# Patient Record
Sex: Male | Born: 1965 | Race: White | Hispanic: No | Marital: Single | Smoking: Current every day smoker
Health system: Southern US, Community
[De-identification: ages and names within clinical notes are randomized; demographics above are authoritative.]

## PROBLEM LIST (undated history)

## (undated) DIAGNOSIS — J449 Chronic obstructive pulmonary disease, unspecified: Secondary | ICD-10-CM

## (undated) DIAGNOSIS — M5430 Sciatica, unspecified side: Secondary | ICD-10-CM

## (undated) DIAGNOSIS — N200 Calculus of kidney: Secondary | ICD-10-CM

## (undated) DIAGNOSIS — F1910 Other psychoactive substance abuse, uncomplicated: Secondary | ICD-10-CM

## (undated) DIAGNOSIS — F419 Anxiety disorder, unspecified: Secondary | ICD-10-CM

## (undated) DIAGNOSIS — L0291 Cutaneous abscess, unspecified: Secondary | ICD-10-CM

## (undated) DIAGNOSIS — G35 Multiple sclerosis: Secondary | ICD-10-CM

## (undated) DIAGNOSIS — G8929 Other chronic pain: Secondary | ICD-10-CM

## (undated) DIAGNOSIS — M549 Dorsalgia, unspecified: Secondary | ICD-10-CM

## (undated) HISTORY — PX: BRAIN SURGERY: SHX531

## (undated) HISTORY — PX: WRIST SURGERY: SHX841

---
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
## 2003-05-22 ENCOUNTER — Ambulatory Visit (HOSPITAL_COMMUNITY): Admission: RE | Admit: 2003-05-22 | Discharge: 2003-05-22 | Payer: Self-pay | Admitting: Internal Medicine

## 2003-05-22 ENCOUNTER — Encounter: Payer: Self-pay | Admitting: Internal Medicine

## 2003-09-11 ENCOUNTER — Ambulatory Visit (HOSPITAL_COMMUNITY): Admission: RE | Admit: 2003-09-11 | Discharge: 2003-09-11 | Payer: Self-pay | Admitting: Internal Medicine

## 2004-01-08 ENCOUNTER — Emergency Department (HOSPITAL_COMMUNITY): Admission: EM | Admit: 2004-01-08 | Discharge: 2004-01-08 | Payer: Self-pay | Admitting: Emergency Medicine

## 2004-09-12 ENCOUNTER — Ambulatory Visit: Payer: Self-pay | Admitting: Orthopedic Surgery

## 2004-12-06 ENCOUNTER — Emergency Department (HOSPITAL_COMMUNITY): Admission: EM | Admit: 2004-12-06 | Discharge: 2004-12-06 | Payer: Self-pay | Admitting: Emergency Medicine

## 2004-12-13 ENCOUNTER — Emergency Department (HOSPITAL_COMMUNITY): Admission: EM | Admit: 2004-12-13 | Discharge: 2004-12-13 | Payer: Self-pay | Admitting: Emergency Medicine

## 2005-02-07 ENCOUNTER — Emergency Department (HOSPITAL_COMMUNITY): Admission: EM | Admit: 2005-02-07 | Discharge: 2005-02-07 | Payer: Self-pay | Admitting: Family Medicine

## 2005-02-28 ENCOUNTER — Ambulatory Visit (HOSPITAL_COMMUNITY): Admission: RE | Admit: 2005-02-28 | Discharge: 2005-02-28 | Payer: Self-pay | Admitting: Family Medicine

## 2005-05-10 ENCOUNTER — Emergency Department: Payer: Self-pay | Admitting: Emergency Medicine

## 2005-06-20 ENCOUNTER — Ambulatory Visit (HOSPITAL_COMMUNITY): Admission: RE | Admit: 2005-06-20 | Discharge: 2005-06-20 | Payer: Self-pay | Admitting: Family Medicine

## 2005-10-28 ENCOUNTER — Emergency Department (HOSPITAL_COMMUNITY): Admission: EM | Admit: 2005-10-28 | Discharge: 2005-10-28 | Payer: Self-pay | Admitting: Emergency Medicine

## 2005-11-12 ENCOUNTER — Emergency Department (HOSPITAL_COMMUNITY): Admission: EM | Admit: 2005-11-12 | Discharge: 2005-11-12 | Payer: Self-pay | Admitting: Emergency Medicine

## 2006-01-20 ENCOUNTER — Emergency Department (HOSPITAL_COMMUNITY): Admission: EM | Admit: 2006-01-20 | Discharge: 2006-01-21 | Payer: Self-pay | Admitting: Emergency Medicine

## 2006-05-14 ENCOUNTER — Ambulatory Visit: Payer: Self-pay | Admitting: Orthopedic Surgery

## 2006-09-05 ENCOUNTER — Emergency Department (HOSPITAL_COMMUNITY): Admission: EM | Admit: 2006-09-05 | Discharge: 2006-09-05 | Payer: Self-pay | Admitting: Emergency Medicine

## 2006-12-05 ENCOUNTER — Emergency Department (HOSPITAL_COMMUNITY): Admission: EM | Admit: 2006-12-05 | Discharge: 2006-12-05 | Payer: Self-pay | Admitting: Emergency Medicine

## 2006-12-17 ENCOUNTER — Emergency Department (HOSPITAL_COMMUNITY): Admission: EM | Admit: 2006-12-17 | Discharge: 2006-12-17 | Payer: Self-pay | Admitting: Emergency Medicine

## 2007-01-12 ENCOUNTER — Emergency Department (HOSPITAL_COMMUNITY): Admission: EM | Admit: 2007-01-12 | Discharge: 2007-01-12 | Payer: Self-pay | Admitting: Emergency Medicine

## 2007-01-23 ENCOUNTER — Emergency Department (HOSPITAL_COMMUNITY): Admission: EM | Admit: 2007-01-23 | Discharge: 2007-01-23 | Payer: Self-pay | Admitting: Emergency Medicine

## 2007-04-23 ENCOUNTER — Emergency Department (HOSPITAL_COMMUNITY): Admission: EM | Admit: 2007-04-23 | Discharge: 2007-04-23 | Payer: Self-pay | Admitting: Emergency Medicine

## 2007-04-25 ENCOUNTER — Ambulatory Visit: Payer: Self-pay | Admitting: Orthopedic Surgery

## 2007-06-06 ENCOUNTER — Ambulatory Visit: Payer: Self-pay | Admitting: Orthopedic Surgery

## 2007-06-06 DIAGNOSIS — S52539A Colles' fracture of unspecified radius, initial encounter for closed fracture: Secondary | ICD-10-CM | POA: Insufficient documentation

## 2007-06-21 ENCOUNTER — Ambulatory Visit: Payer: Self-pay | Admitting: Orthopedic Surgery

## 2007-06-21 ENCOUNTER — Ambulatory Visit (HOSPITAL_COMMUNITY): Admission: RE | Admit: 2007-06-21 | Discharge: 2007-06-21 | Payer: Self-pay | Admitting: Orthopedic Surgery

## 2007-06-27 ENCOUNTER — Telehealth (INDEPENDENT_AMBULATORY_CARE_PROVIDER_SITE_OTHER): Payer: Self-pay | Admitting: *Deleted

## 2007-06-27 ENCOUNTER — Encounter: Payer: Self-pay | Admitting: Orthopedic Surgery

## 2007-06-27 ENCOUNTER — Encounter (INDEPENDENT_AMBULATORY_CARE_PROVIDER_SITE_OTHER): Payer: Self-pay | Admitting: *Deleted

## 2007-07-01 ENCOUNTER — Ambulatory Visit: Payer: Self-pay | Admitting: Orthopedic Surgery

## 2007-07-10 ENCOUNTER — Ambulatory Visit: Payer: Self-pay | Admitting: Orthopedic Surgery

## 2007-07-15 ENCOUNTER — Ambulatory Visit: Payer: Self-pay | Admitting: Orthopedic Surgery

## 2007-07-24 ENCOUNTER — Emergency Department (HOSPITAL_COMMUNITY): Admission: EM | Admit: 2007-07-24 | Discharge: 2007-07-24 | Payer: Self-pay | Admitting: Emergency Medicine

## 2007-07-29 ENCOUNTER — Ambulatory Visit: Payer: Self-pay | Admitting: Orthopedic Surgery

## 2007-08-18 ENCOUNTER — Emergency Department (HOSPITAL_COMMUNITY): Admission: EM | Admit: 2007-08-18 | Discharge: 2007-08-18 | Payer: Self-pay | Admitting: Emergency Medicine

## 2007-08-19 ENCOUNTER — Ambulatory Visit: Payer: Self-pay | Admitting: Orthopedic Surgery

## 2007-08-21 ENCOUNTER — Emergency Department (HOSPITAL_COMMUNITY): Admission: EM | Admit: 2007-08-21 | Discharge: 2007-08-21 | Payer: Self-pay | Admitting: Emergency Medicine

## 2007-08-21 ENCOUNTER — Ambulatory Visit: Payer: Self-pay | Admitting: Internal Medicine

## 2007-09-11 ENCOUNTER — Ambulatory Visit: Payer: Self-pay | Admitting: Internal Medicine

## 2007-09-11 ENCOUNTER — Encounter: Payer: Self-pay | Admitting: Internal Medicine

## 2007-09-11 ENCOUNTER — Ambulatory Visit (HOSPITAL_COMMUNITY): Admission: RE | Admit: 2007-09-11 | Discharge: 2007-09-11 | Payer: Self-pay | Admitting: Internal Medicine

## 2007-09-16 ENCOUNTER — Emergency Department (HOSPITAL_COMMUNITY): Admission: EM | Admit: 2007-09-16 | Discharge: 2007-09-16 | Payer: Self-pay | Admitting: Emergency Medicine

## 2007-09-19 ENCOUNTER — Ambulatory Visit: Payer: Self-pay | Admitting: Orthopedic Surgery

## 2007-09-27 ENCOUNTER — Encounter (HOSPITAL_COMMUNITY): Admission: RE | Admit: 2007-09-27 | Discharge: 2007-10-27 | Payer: Self-pay | Admitting: Orthopedic Surgery

## 2007-09-27 ENCOUNTER — Encounter: Payer: Self-pay | Admitting: Orthopedic Surgery

## 2007-10-10 ENCOUNTER — Emergency Department: Payer: Self-pay | Admitting: Emergency Medicine

## 2007-10-23 ENCOUNTER — Emergency Department (HOSPITAL_COMMUNITY): Admission: EM | Admit: 2007-10-23 | Discharge: 2007-10-23 | Payer: Self-pay | Admitting: Emergency Medicine

## 2007-10-23 ENCOUNTER — Encounter (INDEPENDENT_AMBULATORY_CARE_PROVIDER_SITE_OTHER): Payer: Self-pay | Admitting: *Deleted

## 2007-11-14 ENCOUNTER — Encounter: Payer: Self-pay | Admitting: Orthopedic Surgery

## 2007-11-14 ENCOUNTER — Telehealth: Payer: Self-pay | Admitting: Orthopedic Surgery

## 2007-11-27 ENCOUNTER — Ambulatory Visit: Payer: Self-pay | Admitting: Orthopedic Surgery

## 2007-12-19 ENCOUNTER — Encounter: Payer: Self-pay | Admitting: Orthopedic Surgery

## 2008-01-02 ENCOUNTER — Telehealth: Payer: Self-pay | Admitting: Orthopedic Surgery

## 2008-01-10 ENCOUNTER — Emergency Department (HOSPITAL_COMMUNITY): Admission: EM | Admit: 2008-01-10 | Discharge: 2008-01-10 | Payer: Self-pay | Admitting: Emergency Medicine

## 2008-02-03 ENCOUNTER — Emergency Department (HOSPITAL_COMMUNITY): Admission: EM | Admit: 2008-02-03 | Discharge: 2008-02-03 | Payer: Self-pay | Admitting: Emergency Medicine

## 2008-02-12 ENCOUNTER — Emergency Department (HOSPITAL_COMMUNITY): Admission: EM | Admit: 2008-02-12 | Discharge: 2008-02-12 | Payer: Self-pay | Admitting: Emergency Medicine

## 2008-02-21 ENCOUNTER — Emergency Department (HOSPITAL_COMMUNITY): Admission: EM | Admit: 2008-02-21 | Discharge: 2008-02-21 | Payer: Self-pay | Admitting: Emergency Medicine

## 2008-02-25 ENCOUNTER — Telehealth: Payer: Self-pay | Admitting: Orthopedic Surgery

## 2008-04-03 ENCOUNTER — Emergency Department (HOSPITAL_COMMUNITY): Admission: EM | Admit: 2008-04-03 | Discharge: 2008-04-03 | Payer: Self-pay | Admitting: Emergency Medicine

## 2008-05-04 ENCOUNTER — Encounter: Payer: Self-pay | Admitting: Orthopedic Surgery

## 2008-08-15 ENCOUNTER — Emergency Department (HOSPITAL_COMMUNITY): Admission: EM | Admit: 2008-08-15 | Discharge: 2008-08-15 | Payer: Self-pay | Admitting: Emergency Medicine

## 2008-09-23 ENCOUNTER — Emergency Department (HOSPITAL_COMMUNITY): Admission: EM | Admit: 2008-09-23 | Discharge: 2008-09-23 | Payer: Self-pay | Admitting: Emergency Medicine

## 2008-09-27 ENCOUNTER — Emergency Department (HOSPITAL_COMMUNITY): Admission: EM | Admit: 2008-09-27 | Discharge: 2008-09-27 | Payer: Self-pay | Admitting: Emergency Medicine

## 2008-10-01 ENCOUNTER — Emergency Department (HOSPITAL_COMMUNITY): Admission: EM | Admit: 2008-10-01 | Discharge: 2008-10-01 | Payer: Self-pay | Admitting: Emergency Medicine

## 2008-10-14 ENCOUNTER — Emergency Department (HOSPITAL_COMMUNITY): Admission: EM | Admit: 2008-10-14 | Discharge: 2008-10-14 | Payer: Self-pay | Admitting: Emergency Medicine

## 2008-10-31 ENCOUNTER — Emergency Department (HOSPITAL_COMMUNITY): Admission: EM | Admit: 2008-10-31 | Discharge: 2008-10-31 | Payer: Self-pay | Admitting: Emergency Medicine

## 2008-11-05 ENCOUNTER — Emergency Department (HOSPITAL_COMMUNITY): Admission: EM | Admit: 2008-11-05 | Discharge: 2008-11-05 | Payer: Self-pay | Admitting: Emergency Medicine

## 2008-11-10 ENCOUNTER — Emergency Department (HOSPITAL_COMMUNITY): Admission: EM | Admit: 2008-11-10 | Discharge: 2008-11-10 | Payer: Self-pay | Admitting: Emergency Medicine

## 2008-11-11 ENCOUNTER — Ambulatory Visit (HOSPITAL_COMMUNITY): Admission: RE | Admit: 2008-11-11 | Discharge: 2008-11-11 | Payer: Self-pay | Admitting: Family Medicine

## 2008-12-06 ENCOUNTER — Emergency Department (HOSPITAL_COMMUNITY): Admission: EM | Admit: 2008-12-06 | Discharge: 2008-12-06 | Payer: Self-pay | Admitting: Emergency Medicine

## 2009-01-17 ENCOUNTER — Emergency Department (HOSPITAL_COMMUNITY): Admission: EM | Admit: 2009-01-17 | Discharge: 2009-01-17 | Payer: Self-pay | Admitting: Emergency Medicine

## 2009-01-20 ENCOUNTER — Emergency Department (HOSPITAL_COMMUNITY): Admission: EM | Admit: 2009-01-20 | Discharge: 2009-01-20 | Payer: Self-pay | Admitting: Emergency Medicine

## 2009-01-24 ENCOUNTER — Emergency Department (HOSPITAL_COMMUNITY): Admission: EM | Admit: 2009-01-24 | Discharge: 2009-01-24 | Payer: Self-pay | Admitting: Emergency Medicine

## 2009-03-01 ENCOUNTER — Emergency Department (HOSPITAL_COMMUNITY): Admission: EM | Admit: 2009-03-01 | Discharge: 2009-03-01 | Payer: Self-pay | Admitting: Emergency Medicine

## 2009-04-29 ENCOUNTER — Emergency Department (HOSPITAL_COMMUNITY): Admission: EM | Admit: 2009-04-29 | Discharge: 2009-04-29 | Payer: Self-pay | Admitting: Emergency Medicine

## 2009-05-07 ENCOUNTER — Emergency Department (HOSPITAL_COMMUNITY): Admission: EM | Admit: 2009-05-07 | Discharge: 2009-05-07 | Payer: Self-pay | Admitting: Emergency Medicine

## 2009-06-22 ENCOUNTER — Emergency Department (HOSPITAL_COMMUNITY): Admission: EM | Admit: 2009-06-22 | Discharge: 2009-06-22 | Payer: Self-pay | Admitting: Emergency Medicine

## 2009-07-06 ENCOUNTER — Emergency Department (HOSPITAL_COMMUNITY): Admission: EM | Admit: 2009-07-06 | Discharge: 2009-07-06 | Payer: Self-pay | Admitting: Emergency Medicine

## 2009-07-13 ENCOUNTER — Emergency Department (HOSPITAL_COMMUNITY): Admission: EM | Admit: 2009-07-13 | Discharge: 2009-07-13 | Payer: Self-pay | Admitting: Emergency Medicine

## 2009-07-30 ENCOUNTER — Emergency Department (HOSPITAL_COMMUNITY): Admission: EM | Admit: 2009-07-30 | Discharge: 2009-07-30 | Payer: Self-pay | Admitting: Emergency Medicine

## 2009-08-23 ENCOUNTER — Emergency Department (HOSPITAL_COMMUNITY): Admission: EM | Admit: 2009-08-23 | Discharge: 2009-08-23 | Payer: Self-pay | Admitting: Emergency Medicine

## 2009-09-02 ENCOUNTER — Emergency Department (HOSPITAL_COMMUNITY): Admission: EM | Admit: 2009-09-02 | Discharge: 2009-09-02 | Payer: Self-pay | Admitting: Emergency Medicine

## 2009-09-29 ENCOUNTER — Emergency Department (HOSPITAL_COMMUNITY): Admission: EM | Admit: 2009-09-29 | Discharge: 2009-09-29 | Payer: Self-pay | Admitting: Emergency Medicine

## 2009-10-06 ENCOUNTER — Emergency Department (HOSPITAL_COMMUNITY): Admission: EM | Admit: 2009-10-06 | Discharge: 2009-10-06 | Payer: Self-pay | Admitting: Emergency Medicine

## 2009-10-29 ENCOUNTER — Emergency Department (HOSPITAL_COMMUNITY): Admission: EM | Admit: 2009-10-29 | Discharge: 2009-10-29 | Payer: Self-pay | Admitting: Emergency Medicine

## 2010-01-11 ENCOUNTER — Emergency Department (HOSPITAL_COMMUNITY): Admission: EM | Admit: 2010-01-11 | Discharge: 2010-01-11 | Payer: Self-pay | Admitting: Emergency Medicine

## 2010-01-21 ENCOUNTER — Ambulatory Visit (HOSPITAL_COMMUNITY): Admission: RE | Admit: 2010-01-21 | Discharge: 2010-01-21 | Payer: Self-pay | Admitting: Family Medicine

## 2010-05-26 ENCOUNTER — Emergency Department (HOSPITAL_COMMUNITY)
Admission: EM | Admit: 2010-05-26 | Discharge: 2010-05-26 | Payer: Self-pay | Source: Home / Self Care | Admitting: Emergency Medicine

## 2010-06-13 ENCOUNTER — Emergency Department (HOSPITAL_COMMUNITY): Admission: EM | Admit: 2010-06-13 | Discharge: 2010-06-13 | Payer: Self-pay | Admitting: Emergency Medicine

## 2010-09-03 ENCOUNTER — Encounter: Payer: Self-pay | Admitting: Orthopedic Surgery

## 2010-10-17 ENCOUNTER — Inpatient Hospital Stay (HOSPITAL_COMMUNITY): Payer: Self-pay

## 2010-10-17 ENCOUNTER — Emergency Department (HOSPITAL_COMMUNITY): Payer: Self-pay

## 2010-10-17 ENCOUNTER — Inpatient Hospital Stay (HOSPITAL_COMMUNITY)
Admission: AD | Admit: 2010-10-17 | Discharge: 2010-10-27 | DRG: 026 | Disposition: A | Discharge status: Home Health | Payer: Self-pay | Source: Other Acute Inpatient Hospital | Attending: Neurosurgery | Admitting: Neurosurgery

## 2010-10-17 ENCOUNTER — Emergency Department (HOSPITAL_COMMUNITY)
Admission: EM | Admit: 2010-10-17 | Discharge: 2010-10-17 | Disposition: A | Discharge status: Other Acute Inpatient Hospital | Payer: Self-pay | Attending: Emergency Medicine | Admitting: Emergency Medicine

## 2010-10-17 DIAGNOSIS — R29898 Other symptoms and signs involving the musculoskeletal system: Secondary | ICD-10-CM | POA: Insufficient documentation

## 2010-10-17 DIAGNOSIS — D496 Neoplasm of unspecified behavior of brain: Principal | ICD-10-CM | POA: Diagnosis present

## 2010-10-17 DIAGNOSIS — M272 Inflammatory conditions of jaws: Secondary | ICD-10-CM | POA: Diagnosis present

## 2010-10-17 DIAGNOSIS — I498 Other specified cardiac arrhythmias: Secondary | ICD-10-CM | POA: Insufficient documentation

## 2010-10-17 DIAGNOSIS — K114 Fistula of salivary gland: Secondary | ICD-10-CM | POA: Diagnosis present

## 2010-10-17 DIAGNOSIS — L089 Local infection of the skin and subcutaneous tissue, unspecified: Secondary | ICD-10-CM | POA: Diagnosis present

## 2010-10-17 DIAGNOSIS — R109 Unspecified abdominal pain: Secondary | ICD-10-CM | POA: Insufficient documentation

## 2010-10-17 LAB — CBC
HCT: 42.3 % (ref 39.0–52.0)
Hemoglobin: 14.6 g/dL (ref 13.0–17.0)
MCH: 32.7 pg (ref 26.0–34.0)
MCHC: 34.5 g/dL (ref 30.0–36.0)
MCV: 94.6 fL (ref 78.0–100.0)
Platelets: 236 K/uL (ref 150–400)
RBC: 4.47 MIL/uL (ref 4.22–5.81)
RDW: 12.5 % (ref 11.5–15.5)
WBC: 8.1 10*3/uL (ref 4.0–10.5)

## 2010-10-17 LAB — COMPREHENSIVE METABOLIC PANEL WITH GFR
ALT: 27 U/L (ref 0–53)
Alkaline Phosphatase: 95 U/L (ref 39–117)
BUN: 5 mg/dL — ABNORMAL LOW (ref 6–23)
CO2: 26 meq/L (ref 19–32)
Chloride: 103 meq/L (ref 96–112)
GFR calc non Af Amer: >60 mL/min (ref >60)
Glucose, Bld: 98 mg/dL (ref 70–99)
Potassium: 4 meq/L (ref 3.5–5.1)
Sodium: 139 meq/L (ref 135–145)
Total Bilirubin: 0.7 mg/dL (ref 0.3–1.2)

## 2010-10-17 LAB — URINALYSIS, ROUTINE W REFLEX MICROSCOPIC
Bilirubin Urine: NEGATIVE
Glucose, UA: NEGATIVE mg/dL
Hgb urine dipstick: NEGATIVE
Ketones, ur: NEGATIVE mg/dL
Nitrite: NEGATIVE
Protein, ur: NEGATIVE mg/dL
Specific Gravity, Urine: 1.015 (ref 1.005–1.030)
Urobilinogen, UA: 0.2 mg/dL (ref 0.0–1.0)
pH: 5.5 (ref 5.0–8.0)

## 2010-10-17 LAB — DIFFERENTIAL
Basophils Absolute: 0 K/uL (ref 0.0–0.1)
Basophils Relative: 0 % (ref 0–1)
Eosinophils Absolute: 0.1 K/uL (ref 0.0–0.7)
Eosinophils Relative: 2 % (ref 0–5)
Lymphocytes Relative: 30 % (ref 12–46)
Lymphs Abs: 2.4 10*3/uL (ref 0.7–4.0)
Monocytes Absolute: 0.5 10*3/uL (ref 0.1–1.0)
Monocytes Relative: 6 % (ref 3–12)
Neutro Abs: 5.1 K/uL (ref 1.7–7.7)
Neutrophils Relative %: 63 % (ref 43–77)

## 2010-10-17 LAB — COMPREHENSIVE METABOLIC PANEL
AST: 21 U/L (ref 0–37)
Albumin: 4.1 g/dL (ref 3.5–5.2)
Calcium: 9.5 mg/dL (ref 8.4–10.5)
Creatinine, Ser: 0.9 mg/dL (ref 0.4–1.5)
GFR calc Af Amer: >60 mL/min (ref >60)
Total Protein: 7.3 g/dL (ref 6.0–8.3)

## 2010-10-17 MED ORDER — GADOBENATE DIMEGLUMINE 529 MG/ML IV SOLN
14.0000 mL | Freq: Once | INTRAVENOUS | Status: AC | PRN
  Administered 2010-10-17: 14 mL via INTRAVENOUS

## 2010-10-18 ENCOUNTER — Inpatient Hospital Stay (HOSPITAL_COMMUNITY): Payer: Self-pay

## 2010-10-18 LAB — URINALYSIS, ROUTINE W REFLEX MICROSCOPIC
Glucose, UA: NEGATIVE mg/dL
Ketones, ur: NEGATIVE mg/dL
pH: 6.5 (ref 5.0–8.0)

## 2010-10-18 MED ORDER — IOHEXOL 300 MG/ML  SOLN
100.0000 mL | Freq: Once | INTRAMUSCULAR | Status: AC | PRN
  Administered 2010-10-18: 100 mL via INTRAVENOUS

## 2010-10-18 MED ORDER — GADOBENATE DIMEGLUMINE 529 MG/ML IV SOLN
14.0000 mL | Freq: Once | INTRAVENOUS | Status: AC
  Administered 2010-10-18: 14 mL via INTRAVENOUS

## 2010-10-19 ENCOUNTER — Other Ambulatory Visit: Payer: Self-pay | Admitting: Neurosurgery

## 2010-10-19 LAB — CBC
HCT: 38.2 % — ABNORMAL LOW (ref 39.0–52.0)
Hemoglobin: 13.5 g/dL (ref 13.0–17.0)
MCH: 33.5 pg (ref 26.0–34.0)
MCV: 94.8 fL (ref 78.0–100.0)
Platelets: 214 10*3/uL (ref 150–400)
RBC: 4.03 MIL/uL — ABNORMAL LOW (ref 4.22–5.81)
WBC: 17.5 10*3/uL — ABNORMAL HIGH (ref 4.0–10.5)

## 2010-10-19 LAB — GRAM STAIN

## 2010-10-19 LAB — TYPE AND SCREEN
ABO/RH(D): O POS
Antibody Screen: NEGATIVE

## 2010-10-19 LAB — BASIC METABOLIC PANEL
BUN: 13 mg/dL (ref 6–23)
CO2: 27 mEq/L (ref 19–32)
Chloride: 102 mEq/L (ref 96–112)
Creatinine, Ser: 0.94 mg/dL (ref 0.4–1.5)
Potassium: 4.3 mEq/L (ref 3.5–5.1)

## 2010-10-19 LAB — PHOSPHORUS: Phosphorus: 4.4 mg/dL (ref 2.3–4.6)

## 2010-10-19 LAB — URINE CULTURE: Culture  Setup Time: 201203061341

## 2010-10-19 LAB — ABO/RH: ABO/RH(D): O POS

## 2010-10-20 LAB — HIV ANTIBODY (ROUTINE TESTING W REFLEX): HIV: NONREACTIVE

## 2010-10-20 NOTE — H&P (Signed)
  Sean, Kemp          ACCOUNT NO.:  [id number]  MEDICAL RECORD NO.:  [id number]           PATIENT TYPE:  I  LOCATION:  3114                         FACILITY:  [facility]  PHYSICIAN:  Donalee Citrin, M.D.        DATE OF BIRTH:  [birth year]  DATE OF ADMISSION:  10/17/2010 DATE OF DISCHARGE:                             HISTORY & PHYSICAL   REASON FOR ADMISSION:  Left-sided weakness, right frontal mass.  HISTORY OF PRESENT ILLNESS:  The patient is a 44-year gentleman who reports really onset of symptoms about a week or 2 ago, which was difficulty with speech, difficulty thinking, straight concentrating and weakness on his left arm and left leg, difficulty walking, bumping against walls and doors.  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  He has had headaches, bitemporal, lasting about 5-10 minutes at a time, but nothing profound.  He denies any fevers, chills, nausea, vomiting.  PAST MEDICAL HISTORY:  Remarkable for chronic back pain to which he saw Dr. Ethelene Hal a few years ago who has told that he had a ruptured disk, but it was nonsurgical at that point.  He has undergone orthopedic procedures of his wrist.  He denies any history of hypertension, diabetes, or cancer anywhere.  SOCIAL HISTORY:  He is a nondrinker.  No drug use, but 5-10 years ago, he was taking some IV drugs as well as alcohol.  He does smoke about a pack or 2 cigarettes a day.  REVIEW OF SYSTEMS:  He denies any fevers, chills, nausea, vomiting and positive for headaches, numbness, tingling, weakness on the left side of his body.  PHYSICAL EXAMINATION:  On neurologic exam, the patient is awake and alert.  He is oriented x4.  Pupils are equal and reactive to light. Extraocular movements are intact.  Cranial nerves are intact.  Strength is 5/5 in  his upper and lower extremity on the right.  He does have a mild left hemiparesis, a 4+/5.  There is almost a bit of neglect and apraxia there as well.  There is some mild sensory loss on the left side of his body.  His reflexes appear to be unremarkable.  CT scan does show cystic-appearing posterior right frontal mass that has a very thin enhancing wall consistent with I think could be an abscess, also could be a metastatic lesion versus a primary glioma.  I have recommended we continue to initiate a metastatic workup with CT scans of his chest, abdomen, pelvis; blood cultures; further laboratory workup; and probable biopsy versus excision on Wednesday depending on the medical workup as well as depending on the initial results of the biopsy.          ______________________________ Donalee Citrin, M.D.     GC/MEDQ  D:  10/17/2010  T:  10/18/2010  Job:  [id number]  Electronically Signed by Donalee Citrin M.D. on 10/19/2010 04:02:53 PM

## 2010-10-22 LAB — BASIC METABOLIC PANEL
Calcium: 8.7 mg/dL (ref 8.4–10.5)
Creatinine, Ser: 0.7 mg/dL (ref 0.4–1.5)
GFR calc Af Amer: >60 mL/min (ref >60)
GFR calc non Af Amer: >60 mL/min (ref >60)
Sodium: 140 mEq/L (ref 135–145)

## 2010-10-22 LAB — CBC
MCH: 32.1 pg (ref 26.0–34.0)
MCHC: 33.4 g/dL (ref 30.0–36.0)
Platelets: 193 10*3/uL (ref 150–400)

## 2010-10-22 LAB — VANCOMYCIN, TROUGH: Vancomycin Tr: 10.6 ug/mL (ref 10.0–20.0)

## 2010-10-23 LAB — CULTURE, ROUTINE-ABSCESS
Culture: NO GROWTH
Culture: NO GROWTH

## 2010-10-24 LAB — CULTURE, BLOOD (ROUTINE X 2): Culture: NO GROWTH

## 2010-10-24 LAB — ANAEROBIC CULTURE

## 2010-10-25 LAB — CBC
Platelets: 219 10*3/uL (ref 150–400)
RDW: 12.3 % (ref 11.5–15.5)
WBC: 9.1 10*3/uL (ref 4.0–10.5)

## 2010-10-25 LAB — BASIC METABOLIC PANEL
BUN: 12 mg/dL (ref 6–23)
GFR calc Af Amer: >60 mL/min (ref >60)
GFR calc non Af Amer: >60 mL/min (ref >60)
Potassium: 3.8 mEq/L (ref 3.5–5.1)
Sodium: 136 mEq/L (ref 135–145)

## 2010-10-25 LAB — VANCOMYCIN, TROUGH: Vancomycin Tr: 21.1 ug/mL — ABNORMAL HIGH (ref 10.0–20.0)

## 2010-10-26 ENCOUNTER — Inpatient Hospital Stay (HOSPITAL_COMMUNITY): Payer: Self-pay

## 2010-10-26 LAB — RAPID URINE DRUG SCREEN, HOSP PERFORMED
Barbiturates: NOT DETECTED
Cocaine: POSITIVE — AB
Opiates: NOT DETECTED

## 2010-10-26 LAB — CBC
HCT: 40.3 % (ref 39.0–52.0)
MCV: 100.9 fL — ABNORMAL HIGH (ref 78.0–100.0)
RDW: 12.1 % (ref 11.5–15.5)
WBC: 11.4 10*3/uL — ABNORMAL HIGH (ref 4.0–10.5)

## 2010-10-26 LAB — BASIC METABOLIC PANEL
BUN: 12 mg/dL (ref 6–23)
Chloride: 105 mEq/L (ref 96–112)
Potassium: 4.8 mEq/L (ref 3.5–5.1)

## 2010-10-26 LAB — ETHANOL: Alcohol, Ethyl (B): <5 mg/dL (ref 0–10)

## 2010-10-26 LAB — DIFFERENTIAL
Eosinophils Relative: 2 % (ref 0–5)
Lymphocytes Relative: 16 % (ref 12–46)
Lymphs Abs: 1.8 10*3/uL (ref 0.7–4.0)
Monocytes Absolute: 0.6 10*3/uL (ref 0.1–1.0)

## 2010-10-27 LAB — URINALYSIS, ROUTINE W REFLEX MICROSCOPIC
Bilirubin Urine: NEGATIVE
Glucose, UA: NEGATIVE mg/dL
Hgb urine dipstick: NEGATIVE
Ketones, ur: NEGATIVE mg/dL
pH: 5.5 (ref 5.0–8.0)

## 2010-10-30 NOTE — Op Note (Signed)
NAMEEMMANUEL, Sean Kemp          ACCOUNT NO.:  [id number]  MEDICAL RECORD NO.:  [id number]           PATIENT TYPE:  I  LOCATION:  3114                         FACILITY:  [facility]  PHYSICIAN:  Donalee Citrin, M.D.        DATE OF BIRTH:  1965/10/30  DATE OF PROCEDURE:  10/19/2010 DATE OF DISCHARGE:                              OPERATIVE REPORT   PREOPERATIVE DIAGNOSIS:  Right frontal mass.  POSTOPERATIVE DIAGNOSIS:  Right frontal mass.  PROCEDURE:  Stealth craniotomy for evacuation of right frontal mass.  SURGEON:  Donalee Citrin, MD  ASSISTANT:  Tia Alert, MD  ANESTHESIA:  General endotracheal.  FROZEN SPECIMEN AND MICROBIOLOGY SPECIMENS:  Pending.  Initial frozen specimen consistent with a lymphoproliferative inflammatory process.  HISTORY OF PRESENT ILLNESS:  The patient is a 45 year old gentleman who presented with a week or two history of left hemiparesis.  CT and MRI scan showed a right frontal rim enhancing mass.  The patient presents for evaluation and resection.  The patient was brought to the OR, was induced under general anesthesia, positioned supine, with the neck in slight flexion slightly turned to the left exposing the right hemisphere.  Then using stealth after adequate loading of the stealth MRI, a registration of the surface landmarks.  A linear incision was mapped out.  The head was shaved, prepped and draped in the usual sterile fashion and the lesion was identified in the scalp.  This was used to the center point of a linear incision and linear incision was carried in the bucket handle approach overlying the right ear across the midline to over the left ear and after adequate prepping and draping and infiltration of 10 mL lidocaine with epi, a linear incision was made and the Raney clips were applied and then at this point, stealth was brought back in.  The center point of the lesion was identified and a bur hole was drilled.  Then through that bur hole, the  dura was bipolared and incised in a cruciate fashion using a brain needle.  Attempts were made to aspirate the cyst fluid thinking that the high likelihood that this maybe an abscess and I have aspirated the cyst fluid with a yield on diagnostic fluid for abscess.  We could board the room into the case and treat with IV antibiotics.  However, there was no real aspiratable cyst fluid and there was some couple drops of clear fluid that egressed from the cyst, but from the most part it was followed with very minimal fluid and so at this point, cranial flap was turned for further resection of the mass and so at this point, the flap was turned.  The dura was incised in a reverse C fashion, flapped over the superior sagittal sinus.  Then the center point over the tumor was again appreciated. Corticectomy was used at that point, extending that and frontally taking care not to extend too far posteriorly and to get too close to the motor strip.  The cyst wall was immediately identified.  Initial, a couple specimens were sent for frozen pathology that came back just inflammatory and possible infectious process so some  additional sample in the original syringe which captured approximately 1 drop or 1 mL of fluid was sent off for microbiology.  That microinitial Gram stain came back with just polys and monos and no organisms.  Further resection of the cyst wall cavity and additional tissue ultimately showed some frozen section consistent with a lymphoproliferative disorder and the pathologist confirmed diagnostic tissue so after the complete cyst wall was removed, there was no more abnormal tissue.  Stealth was again reacquired, used each step along the way to confirm adequate anterior- posterior, superior-inferior margins of the resection bed.  Then at this point, the antibiotics were not given prior to the surgery.  However, they were given after initial aspiration of the cyst fluid.  The  patient had been on antibiotics orally prior to admission in the hospital for treatment of a draining pustulous lesion along his left mandible. However, this has stopped draining several weeks ago and he had been off antibiotics for several weeks.  So after resection, meticulous hemostasis was maintained.  The wound was copiously irrigated.  The dura was then reapproximated.  Gelfoam was laid on the top of the dura.  Bone flap was reapplied with the Lorenz plating system and the linear incision was closed with interrupted Vicryl and the galea and staples. The wound was then dressed and the patient went to the recovery room in stable condition.          ______________________________ Donalee Citrin, M.D.     GC/MEDQ  D:  10/19/2010  T:  10/20/2010  Job:  [id number]  Electronically Signed by Donalee Citrin M.D. on 10/30/2010 03:59:32 PM

## 2010-10-31 LAB — URINALYSIS, ROUTINE W REFLEX MICROSCOPIC
Bilirubin Urine: NEGATIVE
Nitrite: NEGATIVE
Urobilinogen, UA: 0.2 mg/dL (ref 0.0–1.0)

## 2010-11-02 LAB — URINALYSIS, ROUTINE W REFLEX MICROSCOPIC
Glucose, UA: NEGATIVE mg/dL
Hgb urine dipstick: NEGATIVE
pH: 5.5 (ref 5.0–8.0)

## 2010-11-04 NOTE — Consult Note (Signed)
  [name], [name]          ACCOUNT NO.:  [id number]  MEDICAL RECORD NO.:  [id number]           PATIENT TYPE:  LOCATION:                                 FACILITY:  PHYSICIAN:  Christopher E. 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  BRIEF HISTORY:  Sean Kemp is a 45 year old gentleman who is presently admitted to the hospital because of left-sided weakness and a right frontal brain mass.  There is question whether this was an infectious or neoplastic process.  He is status post craniotomy with removal of the frontal mass and path pending.  He has apparently also had a chronic draining sore fistula along the edge of the mandible on the left side.  He states that this initially began after having incision and drainage of a cyst in this region about 6-8 months ago. Sometimes, this clears up and sometimes it is drains and swells up a little bit.  When it swells, he seems to have a little bit more pain and discomfort in the jaw and teeth.  PHYSICAL EXAMINATION:  On exam, he has a small fistula which has not swollen.  There is no active drainage noted, but this is very adherent to the mandible inferiorly.  Intraoral exam reveals no significant swelling of the periodontal area.  He does have some cavities in the dental abnormalities.  No obvious intraoral abscess noted.  He has no other evidence of infection and no active drainage presently.  IMPRESSION:  Left neck fistula.  I suspect may be related to chronic dental or mandibular infection osteitis or related to initial periodontal abscess.  Presently, no acute symptoms, but would recommend evaluation by dentist and oral surgeon prior to excising the fistula tract.  Nothing needs to be done  acutely.  Would recommend initial evaluation with oral surgeon or dentist and discuss this with the patient and can follow up in my office as an outpatient for excision of the fistula tract if necessary.   Thank          ______________________________ Kristine Garbe Ezzard Standing, M.D.     CEN/MEDQ  D:  10/21/2010  T:  10/22/2010  Job:  [id number]  Electronically Signed by Dillard Cannon M.D. on 11/04/2010 11:12:53 AM

## 2010-11-10 ENCOUNTER — Other Ambulatory Visit (HOSPITAL_COMMUNITY): Payer: Self-pay | Admitting: Neurosurgery

## 2010-11-10 DIAGNOSIS — R519 Headache, unspecified: Secondary | ICD-10-CM

## 2010-11-15 ENCOUNTER — Other Ambulatory Visit: Payer: Self-pay | Admitting: Neurosurgery

## 2010-11-15 DIAGNOSIS — R51 Headache: Secondary | ICD-10-CM

## 2010-11-16 ENCOUNTER — Other Ambulatory Visit (HOSPITAL_COMMUNITY): Payer: Self-pay | Admitting: Neurosurgery

## 2010-11-16 ENCOUNTER — Ambulatory Visit (HOSPITAL_COMMUNITY)
Admission: RE | Admit: 2010-11-16 | Discharge: 2010-11-16 | Disposition: A | Discharge status: Home / Self Care | Payer: Self-pay | Source: Ambulatory Visit | Attending: Neurosurgery | Admitting: Neurosurgery

## 2010-11-16 ENCOUNTER — Encounter (HOSPITAL_COMMUNITY): Payer: Self-pay

## 2010-11-16 DIAGNOSIS — R51 Headache: Secondary | ICD-10-CM | POA: Insufficient documentation

## 2010-11-16 DIAGNOSIS — G060 Intracranial abscess and granuloma: Secondary | ICD-10-CM | POA: Insufficient documentation

## 2010-11-16 LAB — COMPREHENSIVE METABOLIC PANEL
ALT: 18 U/L (ref 0–53)
Alkaline Phosphatase: 71 U/L (ref 39–117)
CO2: 27 mEq/L (ref 19–32)
Chloride: 105 mEq/L (ref 96–112)
Glucose, Bld: 102 mg/dL — ABNORMAL HIGH (ref 70–99)
Potassium: 3.9 mEq/L (ref 3.5–5.1)
Sodium: 138 mEq/L (ref 135–145)
Total Protein: 7.3 g/dL (ref 6.0–8.3)

## 2010-11-16 LAB — COMPREHENSIVE METABOLIC PANEL WITH GFR
AST: 16 U/L (ref 0–37)
Albumin: 4 g/dL (ref 3.5–5.2)
BUN: 12 mg/dL (ref 6–23)
Calcium: 9.2 mg/dL (ref 8.4–10.5)
Creatinine, Ser: 0.7 mg/dL (ref 0.4–1.5)
GFR calc Af Amer: >60 mL/min (ref >60)
GFR calc non Af Amer: >60 mL/min (ref >60)
Total Bilirubin: 0.5 mg/dL (ref 0.3–1.2)

## 2010-11-16 LAB — URINALYSIS, ROUTINE W REFLEX MICROSCOPIC
Nitrite: NEGATIVE
Specific Gravity, Urine: 1.015 (ref 1.005–1.030)
pH: 7 (ref 5.0–8.0)

## 2010-11-16 LAB — CBC
HCT: 42.1 % (ref 39.0–52.0)
Hemoglobin: 14.4 g/dL (ref 13.0–17.0)
MCHC: 34.2 g/dL (ref 30.0–36.0)
MCV: 102.6 fL — ABNORMAL HIGH (ref 78.0–100.0)
Platelets: 239 K/uL (ref 150–400)
RBC: 4.1 MIL/uL — ABNORMAL LOW (ref 4.22–5.81)
RDW: 12.1 % (ref 11.5–15.5)
WBC: 6.5 10*3/uL (ref 4.0–10.5)

## 2010-11-16 LAB — DIFFERENTIAL
Basophils Absolute: 0 K/uL (ref 0.0–0.1)
Basophils Relative: 0 % (ref 0–1)
Eosinophils Absolute: 0.1 10*3/uL (ref 0.0–0.7)
Eosinophils Relative: 2 % (ref 0–5)
Lymphocytes Relative: 28 % (ref 12–46)
Lymphs Abs: 1.8 K/uL (ref 0.7–4.0)
Monocytes Absolute: 0.4 10*3/uL (ref 0.1–1.0)
Monocytes Relative: 6 % (ref 3–12)
Neutro Abs: 4.1 K/uL (ref 1.7–7.7)
Neutrophils Relative %: 63 % (ref 43–77)

## 2010-11-16 LAB — LIPASE, BLOOD: Lipase: 34 U/L (ref 11–59)

## 2010-11-16 MED ORDER — IOHEXOL 300 MG/ML  SOLN
100.0000 mL | Freq: Once | INTRAMUSCULAR | Status: AC | PRN
  Administered 2010-11-16: 100 mL via INTRAVENOUS

## 2010-11-17 ENCOUNTER — Other Ambulatory Visit (HOSPITAL_COMMUNITY): Payer: Self-pay

## 2010-11-17 ENCOUNTER — Inpatient Hospital Stay: Admission: RE | Admit: 2010-11-17 | Payer: Self-pay | Source: Ambulatory Visit

## 2010-11-17 NOTE — Discharge Summary (Signed)
Sean Kemp, Sean Kemp          ACCOUNT NO.:  [id number]  MEDICAL RECORD NO.:  [id number]           PATIENT TYPE:  I  LOCATION:  3016                         FACILITY:  [facility]  PHYSICIAN:  Donalee Citrin, M.D.        DATE OF BIRTH:  [birth year]  DATE OF ADMISSION:  10/17/2010 DATE OF DISCHARGE:  10/27/2010                              DISCHARGE SUMMARY   ADMITTING DIAGNOSIS:  Intracerebral abscess versus tumor.  FINAL DIAGNOSIS:  Intracerebral abscess.  HOSPITAL COURSE:  The patient is a 45 year old gentleman who was admitted through the emergency department with left-sided weakness, dysarthria, and numbness in the left side with some declining mental status.  Workup revealed a ring-enhancing lesion right frontal.  The patient was admitted and underwent full workup with CT scans of his chest, abdomen, and pelvis to rule out mets, underwent laboratory workup with blood cultures to rule out abscess, and underwent MRI scan to evaluate the lesion.  Metastatic workup was negative.  Blood cultures were also negative.  MRI scan was consistent with possible abscess but also possible primary glioma.  So, the patient was ultimately recommended a stealth craniotomy for biopsy and excision of right frontal mass.  Risks and benefits were explained to the patient.  He understood and agreed to proceed forward.  So, the patient was taken to the OR, underwent the aforementioned procedure.  Initial frozen pathology was consistent with possible primary malignancy consistent with either CNS lymphoma or some other kind of lesion.  The stat gram stain obtained from the OR did not show any bacteria, did show some white cells, so final pathology was sent off and pending.  The patient went to the ICU, recovered there well, was transferred to floor.  Over the several days postoperatively, the patient continued to convalesce and improved significantly with increase left-sided strength, increased alertness,  decreased swelling of his brain.  All the workup and pathology ultimately sent to [facility] and it was felt to be mostly reactive, consistently a demyelinating disease or possibly infection. The patient was maintained on IV antibiotics during all time treating as if it was infection, also maintained on Decadron on a weaning dose and protocol as the patient's clinical exam continued to improve.  There was also a lesion on his left jaw that was draining chronically.  I had ENT seen him, they recommended dental follow up and the patient subsequently was scheduled for this as an outpatient.  Over the next several days, the patient continued to do well with good physical therapy, had a PICC line placed for his chronic IV antibiotics to which he was maintained on vancomycin and Rocephin.  Physical therapy felt the patient was stable to be discharged home with home health as opposed to the inpatient physical therapy, so this was all set up for him and by March 15, the patient was able to be discharged home on IV antibiotics, was ambulating, voiding spontaneously, was discharged with home health and home physical therapy and scheduled follow up in approximately 2 weeks.          ______________________________ Donalee Citrin, M.D.    GC/MEDQ  D:  11/09/2010  T:  11/10/2010  Job:  [id number]  Electronically Signed by Donalee Citrin M.D. on 11/17/2010 08:36:54 AM

## 2010-11-24 LAB — URINALYSIS, ROUTINE W REFLEX MICROSCOPIC
Glucose, UA: NEGATIVE mg/dL
Hgb urine dipstick: NEGATIVE
Ketones, ur: NEGATIVE mg/dL
Protein, ur: NEGATIVE mg/dL

## 2010-11-29 LAB — BASIC METABOLIC PANEL
BUN: 9 mg/dL (ref 6–23)
CO2: 27 mEq/L (ref 19–32)
Chloride: 102 mEq/L (ref 96–112)
Creatinine, Ser: 0.86 mg/dL (ref 0.4–1.5)
Glucose, Bld: 121 mg/dL — ABNORMAL HIGH (ref 70–99)

## 2010-11-29 LAB — DIFFERENTIAL
Basophils Relative: 1 % (ref 0–1)
Eosinophils Absolute: 0.1 10*3/uL (ref 0.0–0.7)
Neutrophils Relative %: 58 % (ref 43–77)

## 2010-11-29 LAB — CBC
MCHC: 35.3 g/dL (ref 30.0–36.0)
MCV: 100.6 fL — ABNORMAL HIGH (ref 78.0–100.0)
Platelets: 297 10*3/uL (ref 150–400)

## 2010-11-29 LAB — POCT CARDIAC MARKERS: Myoglobin, poc: 70.5 ng/mL (ref 12–200)

## 2010-12-06 ENCOUNTER — Ambulatory Visit (HOSPITAL_COMMUNITY)
Admission: RE | Admit: 2010-12-06 | Discharge: 2010-12-06 | Disposition: A | Discharge status: Home / Self Care | Payer: Self-pay | Source: Ambulatory Visit | Attending: Neurosurgery | Admitting: Neurosurgery

## 2010-12-06 DIAGNOSIS — R519 Headache, unspecified: Secondary | ICD-10-CM

## 2010-12-06 DIAGNOSIS — R51 Headache: Secondary | ICD-10-CM | POA: Insufficient documentation

## 2010-12-06 MED ORDER — GADOBENATE DIMEGLUMINE 529 MG/ML IV SOLN: 13.0000 mL | Freq: Once | INTRAVENOUS | Status: AC | PRN

## 2010-12-27 NOTE — Op Note (Signed)
NAME:  Sean Kemp, Sean Kemp          ACCOUNT NO.:  [id number]   MEDICAL RECORD NO.:  [id number]          PATIENT TYPE:  EMS   LOCATION:  ED                            FACILITY:  [facility]   PHYSICIAN:  R. Roetta Sessions, M.D. DATE OF BIRTH:  [birth year]   DATE OF PROCEDURE:  08/21/2007  DATE OF DISCHARGE:                               OPERATIVE REPORT   PROCEDURE PERFORMED:  Emergency esophagogastroduodenoscopy with food  impaction removal.   INDICATIONS FOR PROCEDURE:  45 year old gentleman with chronic reflux  esophageal dysphagia to solid foods, was eating a cheeseburger today,  took a large bite, followed it without chewing very much, felt it stick  behind his breast bone. He came to the ER and saw Dr. Margretta Ditty, who  evaluated him and called me.  I have seen him and his history is as  stated.  I have offered him an emergency EGD with food impaction and  removal as appropriate.  I told him we may not carry out a complete exam  today and if he has food in his upper GI tract, I would not be able to  dilate his esophagus.  His questions were answered, he is agreeable.  It  is notable since he came from the emergency department, he feels that he  is able to swallow somewhat better.  Please see the handwritten history  and physical procedure note.   DESCRIPTION OF PROCEDURE:  O2 saturation, blood pressure, pulse rate,  and respirations were monitored throughout the entire procedure.  Conscious sedation with Versed 4 mg IV and Demerol 100 mg IV in divided  doses, Cetacaine spray for topical pharyngeal anesthesia.  The  instrument Pentax video chip system.   FINDINGS:  Examination of the tubular esophagus revealed a couple of  small pieces of food debris in the distal esophagus, however, there was  no large obstructing bolus.  These small pieces were pushed on through  without difficulty.  He did have distal esophageal erosion extending up  a good 7-8 cm from the EG junction.  He did have  narrowing of the EG  junction with some stricturing and ring formation.  There was no  Barrett's esophagus.  The EG junction was easily traversed.  A complete  exam of the stomach was not carried out as there was a large area of  retained food in the fundus which I elected not to attempt carrying out  a complete examination.  The patient tolerated the procedure very well,  was reacted in endoscopy.   IMPRESSION:  Distal esophageal erosions, distal esophageal  ring/stricture, status post removal of food from the esophagus as  described above, retained food in the stomach, complete exam not carried  out.   RECOMMENDATIONS:  1. Soft, full liquid diet.  2. Begin Zegerid 40 mg orally daily, I have given him a prescription      for the suspension.  3. Gastroesophageal reflux disease literature provided to Mr.      Uy.  4. I will plan to see [name] back in a couple of weeks to      arrange elective EGD with esophageal  dilation.      [name], M.D.  Electronically Signed     RMR/MEDQ  D:  08/21/2007  T:  08/21/2007  Job:  [id number]   cc:   Rhae Lerner. Margretta Ditty, M.D.  [address]  [place]  [place] [address]
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
## 2010-12-27 NOTE — Op Note (Signed)
NAME:  Sean Kemp, Sean Kemp          ACCOUNT NO.:  [id number]   MEDICAL RECORD NO.:  [id number]          PATIENT TYPE:  AMB   LOCATION:  DAY                           FACILITY:  [facility]   PHYSICIAN:  R. Roetta Sessions, M.D. DATE OF BIRTH:  05-Sep-1965   DATE OF PROCEDURE:  09/11/2007  DATE OF DISCHARGE:                               OPERATIVE REPORT   PROCEDURE:  EGD with Elease Hashimoto dilation with biopsy.   INDICATIONS FOR PROCEDURE:  A 45 year old gentleman recently with  chronic esophageal dysphagia and GERD presented with food impaction.  I  recently emergently disimpacted him.  He returns now for a complete exam  and dilation as appropriate.  This approach has been discussed with the  patient at length.  Potential risks, benefits and alternatives have been  reviewed and questions answered.  He was agreeable.  Please see the  doctor dictation in the medical record.   O2 saturation, blood pressure, pulse, respirations were monitored the  entire procedure.  Conscious sedation Versed 6 mg IV, Demerol 100 mg IV  in divided doses.  Cetacaine spray for topical pharyngeal anesthesia.   INSTRUMENT:  Pentax video chip system.   FINDINGS:  Examination achieved of lower esophagus revealed two distal  Tandem Schatzki's rings.  The esophageal mucosa otherwise appeared  normal.  There was no esophagitis.  There was no feline appearance of  the more proximal esophagus.  There were no longitudinal furrows,  nothing to suggest eosinophilic esophagitis.   There was a small almost submucosal appearing nodule about 5 mm in  dimension at the GE junction with a whitish head.  Please see photos.  It appeared to be benign.  The EG junction was easily traversed.  Entered the stomach,  colon, gastric cavity was emptied, insufflated  well with air.  Thorough examination of the gastric mucosa including  retroflexed view of the proximal stomach, esophagogastric junction  demonstrated only a small hiatal hernia.   Pylorus was patent and easily  traversed.  Examination of the bulb and second portion revealed no  abnormalities.  Therapeutic/diagnostic maneuvers were performed.  The  scope was withdrawn.  A 56 French Maloney dilator was passed to full  insertion with ease.  A look back revealed the rings had been disrupted  without apparent complication.  Subsequently the nodule was biopsied for  histologic study.  The patient tolerated the procedure well and was  reactive after endoscopy.   IMPRESSION:  Distal Tandem Schatzki's ring status post dilation with  disruption described above, esophageal nodule biopsied after dilation,  otherwise normal esophagus, small hiatal hernia, otherwise normal  stomach, D1 and D2.   RECOMMENDATIONS:  1. Continue Zegerid 40 mg orally daily.  2. Antireflux material/hiatal hernia literature provided to Mr.      Molzahn.  3. Follow up on path.  4. Further recommendations to follow.  5. Will schedule an appointment with [place] in the office to see how he is      doing.      [name], M.D.  Electronically Signed     RMR/MEDQ  D:  09/11/2007  T:  09/11/2007  Job:  [id number] 

## 2011-01-21 ENCOUNTER — Emergency Department (HOSPITAL_COMMUNITY)
Admission: EM | Admit: 2011-01-21 | Discharge: 2011-01-21 | Disposition: A | Discharge status: Home / Self Care | Payer: Self-pay | Attending: Emergency Medicine | Admitting: Emergency Medicine

## 2011-01-21 DIAGNOSIS — M25539 Pain in unspecified wrist: Secondary | ICD-10-CM | POA: Insufficient documentation

## 2011-04-12 ENCOUNTER — Emergency Department (HOSPITAL_COMMUNITY): Payer: Self-pay

## 2011-04-12 ENCOUNTER — Encounter (HOSPITAL_COMMUNITY): Payer: Self-pay

## 2011-04-12 ENCOUNTER — Emergency Department (HOSPITAL_COMMUNITY)
Admission: EM | Admit: 2011-04-12 | Discharge: 2011-04-12 | Disposition: A | Discharge status: Home / Self Care | Payer: Self-pay | Attending: Emergency Medicine | Admitting: Emergency Medicine

## 2011-04-12 DIAGNOSIS — R51 Headache: Secondary | ICD-10-CM | POA: Insufficient documentation

## 2011-04-12 DIAGNOSIS — G35 Multiple sclerosis: Secondary | ICD-10-CM | POA: Insufficient documentation

## 2011-04-12 DIAGNOSIS — R443 Hallucinations, unspecified: Secondary | ICD-10-CM | POA: Insufficient documentation

## 2011-04-12 HISTORY — DX: Multiple sclerosis: G35

## 2011-04-12 MED ORDER — PREDNISONE (PAK) 10 MG PO TABS: 10.0000 mg | ORAL_TABLET | Freq: Every day | ORAL | Status: AC

## 2011-04-12 MED ORDER — PREDNISONE 20 MG PO TABS
40.0000 mg | ORAL_TABLET | Freq: Once | ORAL | Status: AC
  Administered 2011-04-12: 40 mg via ORAL
  Filled 2011-04-12: qty 2

## 2011-04-12 NOTE — ED Notes (Signed)
Pt reports since yesterday morning has been seeing things that aren't there, talking to people that aren't there, having difficulty controlling left side of body.  Reports has history of MS and had brain surgery in March of 2012.  Denies SI or HI.

## 2011-04-12 NOTE — ED Provider Notes (Signed)
History   Scribed for Vida Roller, MD, the patient was seen in room APA10/APA10. This chart was scribed by Clarita Crane. This patient's care was started at 10:43AM.   CSN: [id number] Arrival date & time: 04/12/2011 10:15 AM  Chief Complaint  Patient presents with  . Hallucinations   The history is provided by the patient, the spouse and medical records.   Sean Kemp is a 45 y.o. male who presents to the Emergency Department complaining of intermittent left sided tremors, visual hallucinations and dizziness described as "my balance is off" onset yesterday and persistent since. Denies fever, nausea, vomiting, chest pain, SOB, HA, changes in vision, SI/HI. Patient states he experienced a hallucination last night in which he saw his sister trying to get into his home but when he went to let her in his brother told him his sister had not been to the house. 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 Also notes he was dx with Multiple Sclerosis in March 2012 but has not experienced significant symptoms since his diagnosis.     Past Medical History  Diagnosis Date  . Multiple sclerosis     Past Surgical History  Procedure Date  . Brain surgery   . Wrist surgery     No family history on file.  History  Substance Use Topics  . Smoking status: Never Smoker   . Smokeless tobacco: Not on file  . Alcohol Use: No      Review of Systems 10 Systems reviewed and are negative for acute change except as noted in the HPI.  Physical Exam  BP 122/83  Pulse 70  Temp(Src) 98.3 F (36.8 C) (Oral)  Resp 18  Ht 5\' 7"  (1.702 m)  Wt 135 lb (61.236 kg)  BMI 21.14 kg/m2  SpO2 100%  Physical Exam  Nursing note and vitals reviewed. Constitutional: He is oriented to person, place, and time. He appears well-developed and well-nourished. No distress.  HENT:  Head: Normocephalic and atraumatic.  Mouth/Throat: Oropharynx is  clear and moist.  Eyes: Conjunctivae are normal. Pupils are equal, round, and reactive to light.  Neck: Neck supple.  Cardiovascular: Normal rate, regular rhythm and normal heart sounds.  Exam reveals no gallop and no friction rub.   No murmur heard. Pulmonary/Chest: Effort normal and breath sounds normal. He has no wheezes.  Abdominal: Soft. Bowel sounds are normal. He exhibits no distension. There is no tenderness.  Musculoskeletal: Normal range of motion. He exhibits no edema.  Neurological: He is alert and oriented to person, place, and time. He is not disoriented. No cranial nerve deficit or sensory deficit. Coordination normal.       Minimal weakness of left upper extremity compared to right but still rate 5/5. Strength normal and equal of bilateral lower extremities. Sensation normal. Decreased DTR bilaterally. Normal finger nose finger test.   Skin: Skin is warm and dry.  Psychiatric: He has a normal mood and affect. His behavior is normal. Judgment and thought content normal.    ED Course  Procedures  MDM Overall the patient appears very well with normal vital signs and essentially normal neurologic exam. Given that he was diagnosed with multiple sclerosis in March after he was diagnosed with a brain abscess and given that he has not been on steroids in several months, this is a likely next up for him today. We'll do a CT scan to rule out any other sources of these  neurologic findings including his mild hallucinations. He does not appear to have any significant focal neurologic deficits today.  In addition there is no tremor or abnormal coordination today. He has a normal finger-nose-finger, normal rapid alternating movements and normal coordination without any limb ataxia. CT scan pending.   Ct Head Wo Contrast  04/12/2011  *RADIOLOGY REPORT*  Clinical Data:  Headache, hallucinations, history prior brain surgery for abscess, multiple sclerosis  CT HEAD WITHOUT CONTRAST  Technique:   Contiguous axial images were obtained from the base of the skull through the vertex without contrast.  Comparison: 11/16/2010  Findings: Prior high right parietal craniotomy. Normal ventricular morphology. No midline shift or mass effect. Focus of encephalomalacia identified at high right parietal lobe at site of prior surgery. Remainder of brain parenchyma normal in appearance. No mass lesion, intracranial hemorrhage, or evidence of acute infarction. Posterior fossa unremarkable. Visualized paranasal sinuses mastoid air cells clear. No acute osseous findings.  IMPRESSION: Postsurgical changes of prior high right parietal craniotomy and resection. No acute intracranial abnormalities.  Original Report Authenticated By: Lollie Marrow, M.D.   I personally performed the services described in this documentation, which was scribed in my presence. The recorded information has been reviewed and considered. Vida Roller, MD  I have reviewed the findings with the patient and will start on prednisone.  Vida Roller, MD 04/12/11 [phone]

## 2011-04-12 NOTE — ED Notes (Signed)
Pt c/o auditory and visual hallucinations. Pt states he has had a brain surgery this past march d/t abscess on brain. Pt states he has temporarily lost vision to his left eye in the past week. Pt states he feels like he did before his surgery "pressure in his head".

## 2011-04-28 ENCOUNTER — Emergency Department (HOSPITAL_COMMUNITY)
Admission: EM | Admit: 2011-04-28 | Discharge: 2011-04-28 | Discharge status: Against Medical Advice | Payer: Self-pay | Attending: Emergency Medicine | Admitting: Emergency Medicine

## 2011-04-28 DIAGNOSIS — Z5320 Procedure and treatment not carried out because of patient's decision for unspecified reasons: Secondary | ICD-10-CM | POA: Insufficient documentation

## 2011-04-28 NOTE — ED Notes (Signed)
Decided he did not want to be seen due to extended wait

## 2011-04-29 ENCOUNTER — Emergency Department (HOSPITAL_COMMUNITY): Payer: Self-pay

## 2011-04-29 ENCOUNTER — Encounter (HOSPITAL_COMMUNITY): Payer: Self-pay | Admitting: *Deleted

## 2011-04-29 ENCOUNTER — Emergency Department (HOSPITAL_COMMUNITY)
Admission: EM | Admit: 2011-04-29 | Discharge: 2011-04-29 | Disposition: A | Discharge status: Home / Self Care | Payer: Self-pay | Attending: Emergency Medicine | Admitting: Emergency Medicine

## 2011-04-29 DIAGNOSIS — M549 Dorsalgia, unspecified: Secondary | ICD-10-CM

## 2011-04-29 DIAGNOSIS — Z888 Allergy status to other drugs, medicaments and biological substances status: Secondary | ICD-10-CM | POA: Insufficient documentation

## 2011-04-29 DIAGNOSIS — M545 Low back pain, unspecified: Secondary | ICD-10-CM | POA: Insufficient documentation

## 2011-04-29 DIAGNOSIS — F17200 Nicotine dependence, unspecified, uncomplicated: Secondary | ICD-10-CM | POA: Insufficient documentation

## 2011-04-29 MED ORDER — HYDROCODONE-ACETAMINOPHEN 5-325 MG PO TABS: 1.0000 | ORAL_TABLET | Freq: Four times a day (QID) | ORAL | Status: AC | PRN

## 2011-04-29 MED ORDER — CYCLOBENZAPRINE HCL 10 MG PO TABS: 10.0000 mg | ORAL_TABLET | Freq: Three times a day (TID) | ORAL | Status: AC | PRN

## 2011-04-29 NOTE — ED Notes (Signed)
Pt c/o lower back pain since Wednesday.

## 2011-04-29 NOTE — ED Provider Notes (Signed)
Scribed for Sean Lennert, MD, the patient was seen in room APA04/APA04. This chart was scribed by [employer]. The patient's care started at 07:29  CSN: [id number] Arrival date & time: 04/29/2011  7:18 AM   Chief Complaint  Patient presents with  . Back Pain   HPI KHUP SAPIA is a 45 y.o. male who presents to the Emergency Department complaining of lower back pain onset, Wednesday 04/26/2011. Suspects he sprained his back when he helped "pick up an elderly man off the ground". Denies abdominal pain, nausea or vomiting. Reports a history of ruptured disc and of pain running down his right leg. HPI ELEMENTS:  Location: Back  Onset: 04/26/2011 Duration: 3 days  Timing: constant  Context:  as above  Associated symptoms: as above     Past Medical History  Diagnosis Date  . Multiple sclerosis   . Back pain    MEDICATIONS:  Previous Medications   ALPRAZOLAM (XANAX) 0.5 MG TABLET    Take 0.5 mg by mouth 3 (three) times daily as needed. Nerves    NAPROXEN SODIUM (ALEVE) 220 MG CAPS    Take 2 capsules by mouth daily. Headache    PRESCRIPTION MEDICATION    Inject into the vein once. Patient received one steroid IV on 02/21/11, 02/23/11, and 02/24/11 by Dr. Pearlean Brownie at [facility].      ALLERGIES:  Allergies as of 04/29/2011 - Review Complete 04/29/2011  Allergen Reaction Noted  . Ultram (tramadol hcl) Hives 11/16/2010      Past Surgical History  Procedure Date  . Brain surgery   . Wrist surgery     History reviewed. No pertinent family history.  History  Substance Use Topics  . Smoking status: Current Everyday Smoker -- 1.0 packs/day  . Smokeless tobacco: Not on file  . Alcohol Use: No      Review of Systems  Constitutional: Negative for fatigue.  HENT: Negative for congestion, sinus pressure and ear discharge.   Eyes: Negative for discharge.  Respiratory: Negative for cough.   Cardiovascular: Negative for chest pain.  Gastrointestinal:  Negative for abdominal pain and diarrhea.  Genitourinary: Negative for frequency and hematuria.  Musculoskeletal: Positive for back pain (Lumber spine tenderness).  Skin: Negative for rash.  Neurological: Negative for seizures and headaches.  Hematological: Negative.   Psychiatric/Behavioral: Negative for hallucinations.  All other systems reviewed and are negative.    Allergies  Ultram  Home Medications     Physical Exam    BP 127/91  Pulse 69  Temp(Src) 97.8 F (36.6 C) (Oral)  Resp 18  Ht 5\' 7"  (1.702 m)  Wt 135 lb (61.236 kg)  BMI 21.14 kg/m2  SpO2 100%  Physical Exam  Nursing note and vitals reviewed. Constitutional: He is oriented to person, place, and time. He appears well-developed and well-nourished.  HENT:  Head: Normocephalic and atraumatic.  Right Ear: External ear normal.  Left Ear: External ear normal.  Mouth/Throat: Oropharynx is clear and moist.  Eyes: Conjunctivae and EOM are normal. No scleral icterus.  Neck: Normal range of motion. Neck supple. No thyromegaly present.  Cardiovascular: Normal rate and regular rhythm.  Exam reveals no gallop and no friction rub.   No murmur heard. Pulmonary/Chest: Effort normal and breath sounds normal. No stridor. He has no wheezes. He has no rales. He exhibits no tenderness.  Abdominal: He exhibits no distension. There is no tenderness. There is no rebound.  Musculoskeletal: Normal range of motion. He exhibits no edema.  Lumbar back: He exhibits tenderness.  Lymphadenopathy:    He has no cervical adenopathy.  Neurological: He is alert and oriented to person, place, and time. No cranial nerve deficit. Coordination normal.  Skin: Skin is warm and dry. No rash noted. He is not diaphoretic. No erythema.  Psychiatric: He has a normal mood and affect. His behavior is normal.    ED Course  Procedures  OTHER DATA REVIEWED: Nursing notes, vital signs, and past medical records reviewed.    DIAGNOSTIC  STUDIES: Oxygen Saturation is 100% on room air, normal by my interpretation.    LABS / RADIOLOGY:  Dg Lumbar Spine Complete  04/29/2011  *RADIOLOGY REPORT*  Clinical Data: Back pain.  Lifting injury.  History of prior ruptured disc.  No history of back surgery.  LUMBAR SPINE - COMPLETE 4+ VIEW  Comparison: 10/18/2010 abdominal CT.  01/21/2010 lumbar spine MR.  Findings: There is mild loss of height of the L5 vertebra involving the superior endplate, compatible with mild compression fracture. No retropulsion.  This appears new compared to the prior CT of 10/18/2010 and previous MRI.  Schmorl's node could mimic this appearance.  Follow-up nonemergent MRI would be useful to assess for marrow edema and determine the acuity.  Otherwise the lumbar spine appears normal.  The alignment is anatomic.  IMPRESSION: Mild depression of the superior L5 endplate is suspicious for an acute to subacute compression fracture with minimal loss of superior endplate vertebral body height (less than 25%) and no retropulsion.  Follow-up nonemergent MRI would be useful to assess for marrow edema.  Superior endplate concave configuration is new compared to recent CT.  Original Report Authenticated By: Andreas Newport, M.D.    ED COURSE / COORDINATION OF CARE: 07:30 - EDMD examined patient and ordered a DG Lumbar Spine Complete 08:30 - EDMD rechecked patient. Xray results discussed. Patient advised to avoid lifting and instructed to follow up with PCP in a couple of days. Muscle relaxer and pain medicine prescribed.  MDM: back pain,  L5 mild compression IMPRESSION: Diagnoses that have been ruled out:  Diagnoses that are still under consideration:  Final diagnoses:  Back pain    PLAN:  Home  The patient is to return the emergency department if there is any worsening of symptoms. I have reviewed the discharge instructions with the patient  CONDITION ON DISCHARGE: Stable  MEDICATIONS GIVEN IN THE E.D. Medications - No  data to display  DISCHARGE MEDICATIONS: New Prescriptions   No medications on file      The chart was scribed for me under my direct supervision.  I personally performed the history, physical, and medical decision making and all procedures in the evaluation of this patient.Sean Lennert, MD 04/29/11 [phone]

## 2011-05-08 ENCOUNTER — Other Ambulatory Visit (HOSPITAL_COMMUNITY): Payer: Self-pay | Admitting: Family Medicine

## 2011-05-08 DIAGNOSIS — M549 Dorsalgia, unspecified: Secondary | ICD-10-CM

## 2011-05-08 LAB — BASIC METABOLIC PANEL
BUN: 5 — ABNORMAL LOW
Creatinine, Ser: 0.78
GFR calc non Af Amer: >60

## 2011-05-08 LAB — DIFFERENTIAL
Basophils Absolute: 0
Eosinophils Absolute: 0.1
Lymphocytes Relative: 38
Lymphs Abs: 2.8
Neutrophils Relative %: 54

## 2011-05-08 LAB — RAPID URINE DRUG SCREEN, HOSP PERFORMED
Amphetamines: NOT DETECTED
Benzodiazepines: POSITIVE — AB
Opiates: POSITIVE — AB
Tetrahydrocannabinol: NOT DETECTED

## 2011-05-08 LAB — CBC
MCV: 100.6 — ABNORMAL HIGH
Platelets: 301
RDW: 12.8
WBC: 7.4

## 2011-05-08 LAB — ACETAMINOPHEN LEVEL: Acetaminophen (Tylenol), Serum: 19.5

## 2011-05-08 LAB — ETHANOL: Alcohol, Ethyl (B): <5

## 2011-05-10 ENCOUNTER — Other Ambulatory Visit (HOSPITAL_COMMUNITY): Payer: Self-pay

## 2011-05-11 LAB — BASIC METABOLIC PANEL
CO2: 27
CO2: 30
Calcium: 9
Calcium: 9.4
GFR calc Af Amer: >60
GFR calc Af Amer: >60
Glucose, Bld: 104 — ABNORMAL HIGH
Potassium: 3.4 — ABNORMAL LOW
Sodium: 136
Sodium: 142

## 2011-05-11 LAB — CBC
HCT: 40.6
Hemoglobin: 13.6
Hemoglobin: 14
MCHC: 36
MCHC: 34.5
RBC: 3.9 — ABNORMAL LOW
RBC: 4.07 — ABNORMAL LOW
RDW: 13.4
WBC: 4.8

## 2011-05-11 LAB — DIFFERENTIAL
Basophils Relative: 1
Eosinophils Absolute: 0.1
Lymphocytes Relative: 33
Lymphocytes Relative: 27
Lymphs Abs: 1.6
Lymphs Abs: 2
Monocytes Absolute: 0.3
Monocytes Relative: 7
Neutro Abs: 2.8
Neutro Abs: 4.8
Neutrophils Relative %: 58

## 2011-05-11 LAB — URINALYSIS, ROUTINE W REFLEX MICROSCOPIC
Bilirubin Urine: NEGATIVE
Glucose, UA: NEGATIVE
Hgb urine dipstick: NEGATIVE
Specific Gravity, Urine: 1.01
pH: 5.5

## 2011-05-11 LAB — HEPATIC FUNCTION PANEL
Albumin: 3.9
Alkaline Phosphatase: 79
Total Protein: 6.6

## 2011-05-11 LAB — LIPASE, BLOOD: Lipase: 24

## 2011-05-15 ENCOUNTER — Ambulatory Visit (HOSPITAL_COMMUNITY)
Admission: RE | Admit: 2011-05-15 | Discharge: 2011-05-15 | Disposition: A | Discharge status: Home / Self Care | Payer: Self-pay | Source: Ambulatory Visit | Attending: Family Medicine | Admitting: Family Medicine

## 2011-05-15 DIAGNOSIS — M545 Low back pain, unspecified: Secondary | ICD-10-CM | POA: Insufficient documentation

## 2011-05-15 DIAGNOSIS — M5137 Other intervertebral disc degeneration, lumbosacral region: Secondary | ICD-10-CM | POA: Insufficient documentation

## 2011-05-15 DIAGNOSIS — M5126 Other intervertebral disc displacement, lumbar region: Secondary | ICD-10-CM | POA: Insufficient documentation

## 2011-05-15 DIAGNOSIS — M51379 Other intervertebral disc degeneration, lumbosacral region without mention of lumbar back pain or lower extremity pain: Secondary | ICD-10-CM | POA: Insufficient documentation

## 2011-05-15 DIAGNOSIS — M549 Dorsalgia, unspecified: Secondary | ICD-10-CM

## 2011-05-23 LAB — HEMOGLOBIN AND HEMATOCRIT, BLOOD
HCT: 39.6
Hemoglobin: 13.8

## 2011-05-23 LAB — BASIC METABOLIC PANEL
BUN: 5 — ABNORMAL LOW
Chloride: 108
GFR calc Af Amer: >60
Potassium: 4.6

## 2011-06-01 LAB — COMPREHENSIVE METABOLIC PANEL
ALT: 43
Alkaline Phosphatase: 134 — ABNORMAL HIGH
CO2: 32
Chloride: 101
GFR calc non Af Amer: >60
Glucose, Bld: 116 — ABNORMAL HIGH
Potassium: 3.5
Sodium: 138

## 2011-06-01 LAB — CBC
Hemoglobin: 14.7
RBC: 4.17 — ABNORMAL LOW
WBC: 9.3

## 2011-06-01 LAB — URINALYSIS, ROUTINE W REFLEX MICROSCOPIC
Bilirubin Urine: NEGATIVE
Glucose, UA: NEGATIVE
Ketones, ur: NEGATIVE
Protein, ur: NEGATIVE

## 2011-06-01 LAB — LIPASE, BLOOD: Lipase: 39

## 2011-06-01 LAB — DIFFERENTIAL
Basophils Relative: 0
Eosinophils Absolute: 0.1
Eosinophils Relative: 1
Monocytes Relative: 6
Neutrophils Relative %: 71

## 2011-06-15 ENCOUNTER — Emergency Department (HOSPITAL_COMMUNITY): Payer: Self-pay

## 2011-06-15 ENCOUNTER — Encounter (HOSPITAL_COMMUNITY): Payer: Self-pay | Admitting: *Deleted

## 2011-06-15 ENCOUNTER — Emergency Department (HOSPITAL_COMMUNITY)
Admission: EM | Admit: 2011-06-15 | Discharge: 2011-06-15 | Disposition: A | Discharge status: Home / Self Care | Payer: Self-pay | Attending: Emergency Medicine | Admitting: Emergency Medicine

## 2011-06-15 DIAGNOSIS — G35 Multiple sclerosis: Secondary | ICD-10-CM | POA: Insufficient documentation

## 2011-06-15 DIAGNOSIS — M25539 Pain in unspecified wrist: Secondary | ICD-10-CM | POA: Insufficient documentation

## 2011-06-15 DIAGNOSIS — F17200 Nicotine dependence, unspecified, uncomplicated: Secondary | ICD-10-CM | POA: Insufficient documentation

## 2011-06-15 MED ORDER — HYDROCODONE-ACETAMINOPHEN 5-325 MG PO TABS: ORAL_TABLET | ORAL | Status: DC

## 2011-06-15 MED ORDER — IBUPROFEN 800 MG PO TABS
800.0000 mg | ORAL_TABLET | Freq: Once | ORAL | Status: AC
  Administered 2011-06-15: 800 mg via ORAL
  Filled 2011-06-15: qty 1

## 2011-06-15 MED ORDER — HYDROCODONE-ACETAMINOPHEN 5-325 MG PO TABS
1.0000 | ORAL_TABLET | Freq: Once | ORAL | Status: AC
  Administered 2011-06-15: 1 via ORAL
  Filled 2011-06-15: qty 1

## 2011-06-15 NOTE — ED Provider Notes (Signed)
History     CSN: [id number] Arrival date & time: 06/15/2011  6:29 PM   First MD Initiated Contact with Patient 06/15/11 1837      Chief Complaint  Patient presents with  . Wrist Pain    right wrist    (Consider location/radiation/quality/duration/timing/severity/associated sxs/prior treatment) HPI Comments: ORIF R radius and ulna ~ 3 yrs ago by dr. Romeo Apple.  No recent trauma.  "hurts to use it".  Patient is a 45 y.o. male presenting with wrist pain. The history is provided by the patient. No language interpreter was used.  Wrist Pain This is a new problem. Episode onset: 3 days ago. The problem occurs constantly. The problem has been unchanged. Pertinent negatives include no fever. Exacerbated by: using R wrist. He has tried nothing for the symptoms.    Past Medical History  Diagnosis Date  . Multiple sclerosis   . Back pain     Past Surgical History  Procedure Date  . Brain surgery   . Wrist surgery     No family history on file.  History  Substance Use Topics  . Smoking status: Current Everyday Smoker -- 1.0 packs/day  . Smokeless tobacco: Not on file  . Alcohol Use: No      Review of Systems  Constitutional: Negative for fever.  Musculoskeletal:       Wrist pain  All other systems reviewed and are negative.    Allergies  Ultram  Home Medications   Current Outpatient Rx  Name Route Sig Dispense Refill  . ALPRAZOLAM 0.5 MG PO TABS Oral Take 0.5 mg by mouth 3 (three) times daily as needed. Nerves     . BC HEADACHE POWDER PO Oral Take 1 packet by mouth as needed. For aches and pain     . NAPROXEN SODIUM 220 MG PO CAPS Oral Take 2 capsules by mouth daily. Headache       BP 132/89  Pulse 73  Temp(Src) 97.5 F (36.4 C) (Oral)  Resp 20  Ht 5\' 7"  (1.702 m)  Wt 145 lb (65.772 kg)  BMI 22.71 kg/m2  SpO2 99%  Physical Exam  Nursing note and vitals reviewed. Constitutional: He is oriented to person, place, and time. He appears well-developed and  well-nourished.  HENT:  Head: Normocephalic and atraumatic.  Eyes: EOM are normal.  Neck: Normal range of motion.  Cardiovascular: Normal rate, regular rhythm, normal heart sounds and intact distal pulses.   Pulmonary/Chest: Effort normal and breath sounds normal. No respiratory distress.  Abdominal: Soft. He exhibits no distension. There is no tenderness.  Musculoskeletal: He exhibits tenderness.       Right wrist: He exhibits decreased range of motion and tenderness. He exhibits no swelling, no effusion, no crepitus, no deformity and no laceration.       Hands: Neurological: He is alert and oriented to person, place, and time.  Skin: Skin is warm and dry.  Psychiatric: He has a normal mood and affect. Judgment normal.    ED Course  Procedures (including critical care time)  Labs Reviewed - No data to display Dg Wrist Complete Right  06/15/2011  *RADIOLOGY REPORT*  Clinical Data: Wrist pain.  Prior fracture and surgical fixation  RIGHT WRIST - COMPLETE 3+ VIEW  Comparison: 07/30/2009  Findings: Fracture of the distal radius has healed.  There is irregularity of the articular surface of the distal radius.  There is a metal plate  and  multiple screws on the volar surface of the distal radius.  Hardware is unchanged from the  prior study.  No superimposed acute fracture.  IMPRESSION: Post traumatic osteoarthritis of the radiocarpal joint.  Healed fracture of the distal radius.  Negative for acute fracture.  Original Report Authenticated By: Camelia Phenes, M.D.     No diagnosis found.    MDM          Worthy Rancher, PA 06/15/11 1946

## 2011-06-15 NOTE — ED Notes (Signed)
Pt a/o x4. resp even and unlabored. NAD at this time. D/C instructions and Rx x 1 reviewed. Pt verbalized understanding. Pt ambulated to POV with steady gate. Friend with pt to transport.

## 2011-06-15 NOTE — ED Notes (Signed)
C/o right wrist pain x 1 week; hx of previous fx right wrist with surgical repair; denies recent injury

## 2011-06-16 NOTE — ED Provider Notes (Signed)
Medical screening examination/treatment/procedure(s) were performed by non-physician practitioner and as supervising physician I was immediately available for consultation/collaboration.   Nelia Shi, MD 06/16/11 1534

## 2011-09-09 ENCOUNTER — Emergency Department (HOSPITAL_COMMUNITY)
Admission: EM | Admit: 2011-09-09 | Discharge: 2011-09-09 | Disposition: A | Discharge status: Home / Self Care | Payer: Medicaid Other | Attending: Emergency Medicine | Admitting: Emergency Medicine

## 2011-09-09 ENCOUNTER — Encounter (HOSPITAL_COMMUNITY): Payer: Self-pay

## 2011-09-09 ENCOUNTER — Emergency Department (HOSPITAL_COMMUNITY): Payer: Medicaid Other

## 2011-09-09 DIAGNOSIS — X500XXA Overexertion from strenuous movement or load, initial encounter: Secondary | ICD-10-CM | POA: Insufficient documentation

## 2011-09-09 DIAGNOSIS — S93609A Unspecified sprain of unspecified foot, initial encounter: Secondary | ICD-10-CM

## 2011-09-09 DIAGNOSIS — G35 Multiple sclerosis: Secondary | ICD-10-CM | POA: Insufficient documentation

## 2011-09-09 MED ORDER — IBUPROFEN 800 MG PO TABS
800.0000 mg | ORAL_TABLET | Freq: Once | ORAL | Status: AC
  Administered 2011-09-09: 800 mg via ORAL
  Filled 2011-09-09: qty 1

## 2011-09-09 MED ORDER — HYDROCODONE-ACETAMINOPHEN 5-325 MG PO TABS: 1.0000 | ORAL_TABLET | ORAL | Status: AC | PRN

## 2011-09-09 NOTE — ED Notes (Signed)
Pt reports got up out of chair last night and twisted r foot.  C/O pain to lateral right foot, swelling noted.

## 2011-09-09 NOTE — ED Provider Notes (Signed)
History     CSN: [id number]  Arrival date & time 09/09/11  4098   First MD Initiated Contact with Patient 09/09/11 [phone]      Chief Complaint  Patient presents with  . Foot Pain    (Consider location/radiation/quality/duration/timing/severity/associated sxs/prior treatment) HPI Comments: Patient here after getting out of the chair last night and twisting his right foot - here with right foot pain - denies ankle pain - is able to ambulate on the foot - swelling and bruising noted to right lateral dorsal area of foot.  Patient is a 46 y.o. male presenting with lower extremity pain. The history is provided by the patient. No language interpreter was used.  Foot Pain This is a new problem. The current episode started yesterday. The problem occurs constantly. The problem has been gradually worsening. Associated symptoms include arthralgias and joint swelling. Pertinent negatives include no abdominal pain, anorexia, change in bowel habit, chest pain, chills, congestion, coughing, diaphoresis, fatigue, fever, headaches, myalgias, nausea, neck pain, numbness, rash, sore throat, swollen glands, urinary symptoms, vertigo, visual change, vomiting or weakness. The symptoms are aggravated by walking. He has tried nothing for the symptoms. The treatment provided no relief.    Past Medical History  Diagnosis Date  . Multiple sclerosis   . Back pain     Past Surgical History  Procedure Date  . Brain surgery   . Wrist surgery     No family history on file.  History  Substance Use Topics  . Smoking status: Current Everyday Smoker -- 1.0 packs/day  . Smokeless tobacco: Not on file  . Alcohol Use: No      Review of Systems  Constitutional: Negative for fever, chills, diaphoresis and fatigue.  HENT: Negative for congestion, sore throat and neck pain.   Respiratory: Negative for cough.   Cardiovascular: Negative for chest pain.  Gastrointestinal: Negative for nausea, vomiting, abdominal  pain, anorexia and change in bowel habit.  Musculoskeletal: Positive for joint swelling and arthralgias. Negative for myalgias.  Skin: Negative for rash.  Neurological: Negative for vertigo, weakness, numbness and headaches.  All other systems reviewed and are negative.    Allergies  Ultram  Home Medications   Current Outpatient Rx  Name Route Sig Dispense Refill  . ALPRAZOLAM 0.5 MG PO TABS Oral Take 0.5 mg by mouth 3 (three) times daily as needed. Nerves     . BC HEADACHE POWDER PO Oral Take 1 packet by mouth as needed. For aches and pain     . HYDROCODONE-ACETAMINOPHEN 5-325 MG PO TABS  One tab po q 4-6 hrs prn pain 20 tablet 0  . NAPROXEN SODIUM 220 MG PO CAPS Oral Take 2 capsules by mouth daily. Headache       BP 120/62  Pulse 63  Temp(Src) 97.7 F (36.5 C) (Oral)  Resp 18  Ht 5\' 7"  (1.702 m)  Wt 145 lb (65.772 kg)  BMI 22.71 kg/m2  SpO2 99%  Physical Exam  Nursing note and vitals reviewed. Constitutional: He is oriented to person, place, and time. He appears well-developed and well-nourished. No distress.  HENT:  Head: Normocephalic and atraumatic.  Right Ear: External ear normal.  Left Ear: External ear normal.  Nose: Nose normal.  Mouth/Throat: Oropharynx is clear and moist. No oropharyngeal exudate.  Eyes: Conjunctivae are normal. Pupils are equal, round, and reactive to light. No scleral icterus.  Neck: Normal range of motion. Neck supple.  Cardiovascular: Normal rate, regular rhythm and normal heart sounds.  Exam reveals  no gallop and no friction rub.   No murmur heard. Pulmonary/Chest: Effort normal and breath sounds normal. No respiratory distress. He exhibits no tenderness.  Abdominal: Soft. Bowel sounds are normal. He exhibits no distension. There is no tenderness.  Musculoskeletal:       Right foot: He exhibits decreased range of motion, tenderness, bony tenderness and swelling. He exhibits normal capillary refill and no deformity.        Feet:  Lymphadenopathy:    He has no cervical adenopathy.  Neurological: He is alert and oriented to person, place, and time. No cranial nerve deficit. He exhibits normal muscle tone. Coordination normal.  Skin: Skin is warm and dry. No rash noted. No erythema. No pallor.  Psychiatric: He has a normal mood and affect. His behavior is normal. Judgment and thought content normal.    ED Course  Procedures (including critical care time)  Labs Reviewed - No data to display No results found. Results for orders placed during the hospital encounter of 10/17/10  MRSA PCR SCREENING      Component Value Range   MRSA by PCR    NEGATIVE    Value: NEGATIVE            The GeneXpert MRSA Assay (FDA     approved for NASAL specimens     only), is one component of a     comprehensive MRSA colonization     surveillance program. It is not     intended to diagnose MRSA     infection nor to guide or     monitor treatment for     MRSA infections.  URINALYSIS, ROUTINE W REFLEX MICROSCOPIC      Component Value Range   Color, Urine YELLOW  YELLOW    APPearance CLEAR  CLEAR    Specific Gravity, Urine 1.008  1.005 - 1.030    pH 6.5  5.0 - 8.0    Glucose, UA NEGATIVE  NEGATIVE (mg/dL)   Hgb urine dipstick NEGATIVE  NEGATIVE    Bilirubin Urine NEGATIVE  NEGATIVE    Ketones, ur NEGATIVE  NEGATIVE (mg/dL)   Protein, ur NEGATIVE  NEGATIVE (mg/dL)   Urobilinogen, UA 0.2  0.0 - 1.0 (mg/dL)   Nitrite NEGATIVE  NEGATIVE    Leukocytes, UA    NEGATIVE    Value: NEGATIVE MICROSCOPIC NOT DONE ON URINES WITH NEGATIVE PROTEIN, BLOOD, LEUKOCYTES, NITRITE, OR GLUCOSE <1000 mg/dL.  TYPE AND SCREEN      Component Value Range   ABO/RH(D) O POS     Antibody Screen NEG     Sample Expiration 10/22/2010    ABO/RH      Component Value Range   ABO/RH(D) O POS    CULTURE, BLOOD (ROUTINE X 2)      Component Value Range   Specimen Description BLOOD RIGHT ARM     Special Requests BOTTLES DRAWN AEROBIC AND ANAEROBIC 10CC  EACH     Setup Time [id number]     Culture NO GROWTH 5 DAYS     Report Status 10/24/2010 FINAL    CULTURE, BLOOD (ROUTINE X 2)      Component Value Range   Specimen Description BLOOD RIGHT WRIST     Special Requests BOTTLES DRAWN AEROBIC AND ANAEROBIC 10CC EACH     Setup Time [id number]     Culture NO GROWTH 5 DAYS     Report Status 10/24/2010 FINAL    URINE CULTURE      Component Value Range   Specimen  Description URINE, CATHETERIZED     Special Requests NONE     Setup Time [id number]     Colony Count NO GROWTH     Culture NO GROWTH     Report Status 10/19/2010 FINAL    GRAM STAIN      Component Value Range   Specimen Description ABSCESS     Special Requests PT ON ANCEF     Gram Stain       Value: MODERATE WBC PRESENT,BOTH PMN AND MONONUCLEAR     NO ORGANISMS SEEN     Gram Stain Report Called to,Read Back By and Verified With: Dorie RN 15:05 10/19/10 ([email or web address])   Report Status 10/19/2010 FINAL    GRAM STAIN      Component Value Range   Specimen Description ABSCESS     Special Requests PATIENT ON FOLLOWING ANCEF RIGHT FRONTAL MASS     Gram Stain       Value: MODERATE WBC PRESENT,BOTH PMN AND MONONUCLEAR     NO ORGANISMS SEEN   Report Status 10/19/2010 FINAL    CBC      Component Value Range   WBC 17.5 (*) 4.0 - 10.5 (K/uL)   RBC 4.03 (*) 4.22 - 5.81 (MIL/uL)   Hemoglobin 13.5  13.0 - 17.0 (g/dL)   HCT 21.3 (*) 08.6 - 52.0 (%)   MCV 94.8  78.0 - 100.0 (fL)   MCH 33.5  26.0 - 34.0 (pg)   MCHC 35.3  30.0 - 36.0 (g/dL)   RDW 57.8  46.9 - 62.9 (%)   Platelets 214  150 - 400 (K/uL)  BASIC METABOLIC PANEL      Component Value Range   Sodium 138  135 - 145 (mEq/L)   Potassium 4.3  3.5 - 5.1 (mEq/L)   Chloride 102  96 - 112 (mEq/L)   CO2 27  19 - 32 (mEq/L)   Glucose, Bld 130 (*) 70 - 99 (mg/dL)   BUN 13  6 - 23 (mg/dL)   Creatinine, Ser 5.28  0.4 - 1.5 (mg/dL)   Calcium 9.4  8.4 - 41.3 (mg/dL)   GFR calc non Af Amer >60  >60 (mL/min)   GFR calc Af Amer    >60  (mL/min)   Value: >60            The eGFR has been calculated     using the MDRD equation.     This calculation has not been     validated in all clinical     situations.     eGFR's persistently     <60 mL/min signify     possible Chronic Kidney Disease.  PHOSPHORUS      Component Value Range   Phosphorus 4.4  2.3 - 4.6 (mg/dL)  HIV ANTIBODY      Component Value Range   HIV NON REACTIVE  NON REACTIVE   CULTURE, ROUTINE-ABSCESS      Component Value Range   Specimen Description ABSCESS     Special Requests PATIENT ON FOLLOWING ANCEF RIGHT FRONTAL MASS     Gram Stain       Value: MODERATE WBC PRESENT,BOTH PMN AND MONONUCLEAR     NO ORGANISMS SEEN     Performed at [facility]   Culture NO GROWTH 3 DAYS     Report Status 10/23/2010 FINAL    ANAEROBIC CULTURE      Component Value Range   Specimen Description ABSCESS     Special Requests PATIENT ON FOLLOWING ANCEF  RIGHT FRONTAL MASS     Gram Stain       Value: MODERATE WBC PRESENT,BOTH PMN AND MONONUCLEAR     NO ORGANISMS SEEN     Performed at [facility]   Culture NO ANAEROBES ISOLATED     Report Status 10/24/2010 FINAL    CULTURE, ROUTINE-ABSCESS      Component Value Range   Specimen Description ABSCESS     Special Requests PT ON ANCEF     Gram Stain       Value: MODERATE WBC PRESENT,BOTH PMN AND MONONUCLEAR     NO SQUAMOUS EPITHELIAL CELLS SEEN     NO ORGANISMS SEEN   Culture NO GROWTH 3 DAYS     Report Status 10/23/2010 FINAL    CBC      Component Value Range   WBC 10.3  4.0 - 10.5 (K/uL)   RBC 3.71 (*) 4.22 - 5.81 (MIL/uL)   Hemoglobin 11.9 (*) 13.0 - 17.0 (g/dL)   HCT 16.1 (*) 09.6 - 52.0 (%)   MCV 96.0  78.0 - 100.0 (fL)   MCH 32.1  26.0 - 34.0 (pg)   MCHC 33.4  30.0 - 36.0 (g/dL)   RDW 04.5  40.9 - 81.1 (%)   Platelets 193  150 - 400 (K/uL)  BASIC METABOLIC PANEL      Component Value Range   Sodium 140  135 - 145 (mEq/L)   Potassium 4.0  3.5 - 5.1 (mEq/L)   Chloride 106  96 - 112 (mEq/L)    CO2 30  19 - 32 (mEq/L)   Glucose, Bld 91  70 - 99 (mg/dL)   BUN 11  6 - 23 (mg/dL)   Creatinine, Ser 9.14  0.4 - 1.5 (mg/dL)   Calcium 8.7  8.4 - 78.2 (mg/dL)   GFR calc non Af Amer >60  >60 (mL/min)   GFR calc Af Amer    >60 (mL/min)   Value: >60            The eGFR has been calculated     using the MDRD equation.     This calculation has not been     validated in all clinical     situations.     eGFR's persistently     <60 mL/min signify     possible Chronic Kidney Disease.  VANCOMYCIN, TROUGH      Component Value Range   Vancomycin Tr 10.6  10.0 - 20.0 (ug/mL)  CBC      Component Value Range   WBC 9.1  4.0 - 10.5 (K/uL)   RBC 3.64 (*) 4.22 - 5.81 (MIL/uL)   Hemoglobin 12.1 (*) 13.0 - 17.0 (g/dL)   HCT 95.6 (*) 21.3 - 52.0 (%)   MCV 94.0  78.0 - 100.0 (fL)   MCH 33.2  26.0 - 34.0 (pg)   MCHC 35.4  30.0 - 36.0 (g/dL)   RDW 08.6  57.8 - 46.9 (%)   Platelets 219  150 - 400 (K/uL)  BASIC METABOLIC PANEL      Component Value Range   Sodium 136  135 - 145 (mEq/L)   Potassium 3.8  3.5 - 5.1 (mEq/L)   Chloride 104  96 - 112 (mEq/L)   CO2 26  19 - 32 (mEq/L)   Glucose, Bld 96  70 - 99 (mg/dL)   BUN 12  6 - 23 (mg/dL)   Creatinine, Ser 6.29  0.4 - 1.5 (mg/dL)   Calcium 8.2 (*) 8.4 - 10.5 (mg/dL)  GFR calc non Af Amer >60  >60 (mL/min)   GFR calc Af Amer    >60 (mL/min)   Value: >60            The eGFR has been calculated     using the MDRD equation.     This calculation has not been     validated in all clinical     situations.     eGFR's persistently     <60 mL/min signify     possible Chronic Kidney Disease.  VANCOMYCIN, TROUGH      Component Value Range   Vancomycin Tr 21.1 (*) 10.0 - 20.0 (ug/mL)   Dg Foot Complete Right  09/09/2011  *RADIOLOGY REPORT*  Clinical Data: Pain to right lateral foot.  Twisted foot.  RIGHT FOOT COMPLETE - 3+ VIEW  Comparison: None  Findings: There is no evidence of fracture or dislocation.  There is no evidence of arthropathy or  other focal bone abnormality. Soft tissues are unremarkable.  IMPRESSION: Negative exam.  Original Report Authenticated By: Rosealee Albee, M.D.     Right foot sprain    MDM  Patient without fracture on x-ray - will place in post op shoe and then follow up with ortho if needed.        Izola Price [place], [place] 09/09/11 [phone]

## 2011-09-11 NOTE — ED Provider Notes (Signed)
Medical screening examination/treatment/procedure(s) were performed by non-physician practitioner and as supervising physician I was immediately available for consultation/collaboration.  Shundra Wirsing, MD 09/11/11 1046 

## 2011-12-26 IMAGING — CR DG CHEST 1V PORT
1 series · 1 of 1 positions shown · non-contrast
Comparison: 10/29/2009

CLINICAL DATA: PICC placement

PORTABLE CHEST - 1 VIEW

[AP]
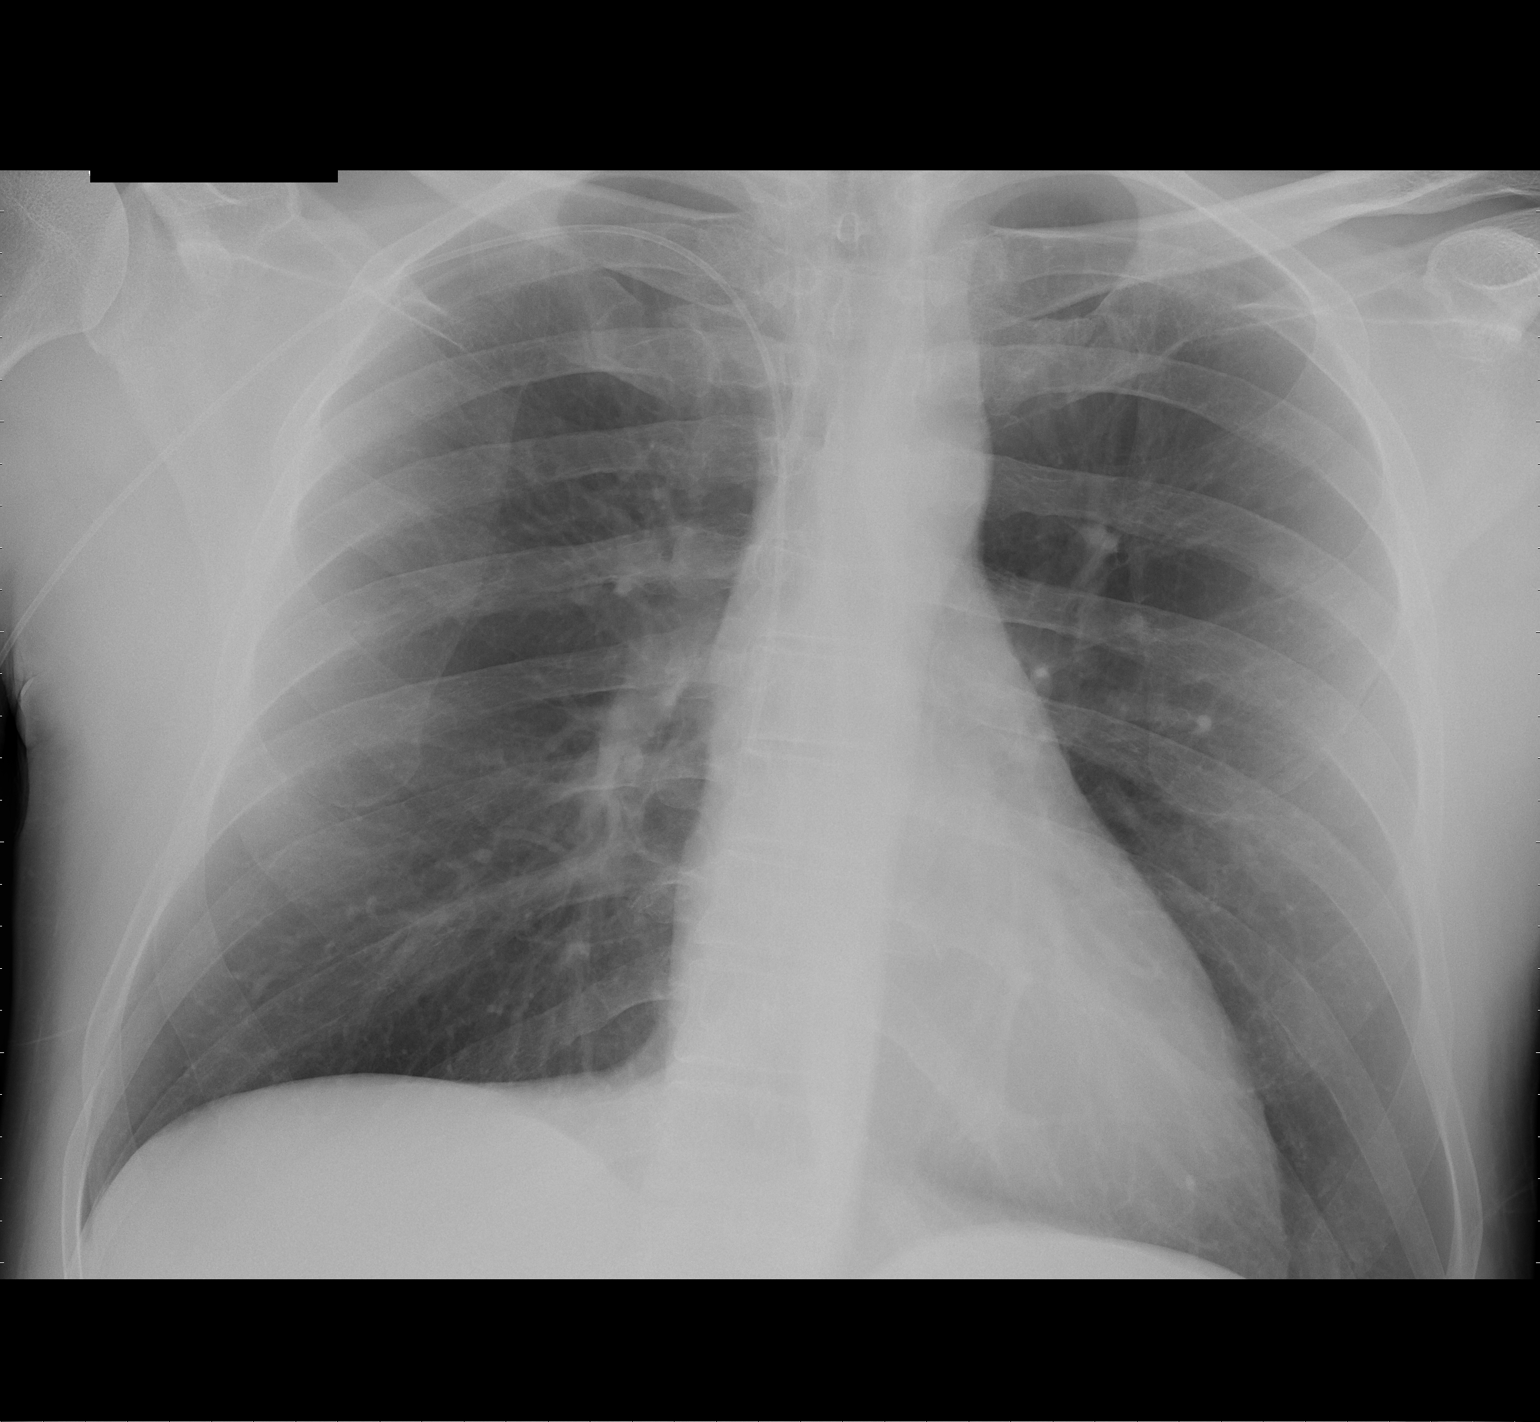

[1 of 1 positions shown; findings below may reference images not displayed]

FINDINGS: A right arm PICC has been placed with its tip at or just
above the cavoatrial junction.  Heart and lungs normal in one-view.
IMPRESSION: Right arm PICC well positioned - no active disease.

## 2012-01-06 ENCOUNTER — Encounter (HOSPITAL_COMMUNITY): Payer: Self-pay | Admitting: *Deleted

## 2012-01-06 ENCOUNTER — Emergency Department (HOSPITAL_COMMUNITY)
Admission: EM | Admit: 2012-01-06 | Discharge: 2012-01-06 | Disposition: A | Discharge status: Home / Self Care | Payer: Medicaid Other | Attending: Emergency Medicine | Admitting: Emergency Medicine

## 2012-01-06 DIAGNOSIS — F17200 Nicotine dependence, unspecified, uncomplicated: Secondary | ICD-10-CM | POA: Insufficient documentation

## 2012-01-06 DIAGNOSIS — M545 Low back pain, unspecified: Secondary | ICD-10-CM | POA: Insufficient documentation

## 2012-01-06 DIAGNOSIS — G35 Multiple sclerosis: Secondary | ICD-10-CM | POA: Insufficient documentation

## 2012-01-06 DIAGNOSIS — S39012A Strain of muscle, fascia and tendon of lower back, initial encounter: Secondary | ICD-10-CM

## 2012-01-06 MED ORDER — PREDNISONE 10 MG PO TABS: ORAL_TABLET | ORAL | Status: DC

## 2012-01-06 MED ORDER — HYDROCODONE-ACETAMINOPHEN 5-325 MG PO TABS: ORAL_TABLET | ORAL | Status: AC

## 2012-01-06 MED ORDER — CYCLOBENZAPRINE HCL 10 MG PO TABS: 10.0000 mg | ORAL_TABLET | Freq: Three times a day (TID) | ORAL | Status: AC | PRN

## 2012-01-06 NOTE — ED Provider Notes (Signed)
History     CSN: [id number]  Arrival date & time 01/06/12  4098   First MD Initiated Contact with Patient 01/06/12 [phone]      Chief Complaint  Patient presents with  . Back Pain    (Consider location/radiation/quality/duration/timing/severity/associated sxs/prior treatment) HPI Comments: Patient with a history of chronic lower back pain complains of increasing pain for 2 days. Pain began after he was mowing his lawn. He states the pain is worse with bending and certain movements and improves with rest. He denies any radiation of pain. He describes the pain as an aching sensation across his lower back. He denies any fall, dysuria, abdominal pain, incontinence of urine or feces, or perineal numbness.  Patient is a 46 y.o. male presenting with back pain. The history is provided by the patient.  Back Pain  This is a chronic problem. The current episode started 2 days ago. The problem occurs constantly. The problem has been gradually worsening. The pain is associated with twisting. The pain is present in the lumbar spine. The quality of the pain is described as aching. The pain does not radiate. The pain is moderate. The symptoms are aggravated by twisting, bending and certain positions. The pain is the same all the time. Pertinent negatives include no chest pain, no fever, no numbness, no abdominal pain, no abdominal swelling, no bowel incontinence, no perianal numbness, no bladder incontinence, no dysuria, no pelvic pain, no leg pain, no paresthesias, no paresis, no tingling and no weakness. He has tried nothing for the symptoms. The treatment provided no relief.    Past Medical History  Diagnosis Date  . Multiple sclerosis   . Back pain     Past Surgical History  Procedure Date  . Brain surgery   . Wrist surgery     History reviewed. No pertinent family history.  History  Substance Use Topics  . Smoking status: Current Everyday Smoker -- 1.0 packs/day    Types: Cigarettes  .  Smokeless tobacco: Not on file  . Alcohol Use: No      Review of Systems  Constitutional: Negative for fever.  Respiratory: Negative for shortness of breath.   Cardiovascular: Negative for chest pain.  Gastrointestinal: Negative for vomiting, abdominal pain, constipation and bowel incontinence.  Genitourinary: Negative for bladder incontinence, dysuria, hematuria, flank pain, decreased urine volume, difficulty urinating and pelvic pain.       Perineal numbness or incontinence of urine or feces  Musculoskeletal: Positive for back pain. Negative for joint swelling.  Skin: Negative for rash.  Neurological: Negative for tingling, weakness, numbness and paresthesias.  All other systems reviewed and are negative.    Allergies  Ultram  Home Medications  No current outpatient prescriptions on file.  BP 123/70  Pulse 59  Temp(Src) 97.7 F (36.5 C) (Oral)  Resp 16  Ht 5\' 7"  (1.702 m)  Wt 140 lb (63.504 kg)  BMI 21.93 kg/m2  SpO2 100%  Physical Exam  Nursing note and vitals reviewed. Constitutional: He is oriented to person, place, and time. He appears well-developed and well-nourished. No distress.  HENT:  Head: Normocephalic and atraumatic.  Neck: Normal range of motion. Neck supple.  Cardiovascular: Normal rate, regular rhythm and intact distal pulses.   No murmur heard. Pulmonary/Chest: Effort normal and breath sounds normal.  Abdominal: He exhibits no distension. There is no tenderness.  Musculoskeletal: He exhibits tenderness. He exhibits no edema.       Lumbar back: He exhibits tenderness and pain. He exhibits normal  range of motion, no swelling, no deformity, no laceration and normal pulse.       Back:  Neurological: He is alert and oriented to person, place, and time. No sensory deficit. He exhibits normal muscle tone. Coordination and gait normal.  Reflex Scores:      Patellar reflexes are 2+ on the right side and 2+ on the left side.      Achilles reflexes are 2+  on the right side and 2+ on the left side. Skin: Skin is warm and dry.    ED Course  Procedures (including critical care time)       MDM    Previous medical charts, nursing notes and vitals signs from this visit were reviewed by me   All laboratory results and/or imaging results performed on this visit, if applicable, were reviewed by me and discussed with the patient and/or parent as well as recommendation for follow-up    MEDICATIONS GIVEN IN ED:  none  Patient has ttp of the lumbar paraspinal muscles, left > right.  No focal neuro deficits on exam.  Ambulates with a steady gait.   No edema or abrasions.  Pt has hx of chronic back pain    PRESCRIPTIONS GIVEN AT DISCHARGE:  Norco #20, flexeril, prednisone taper   Pt stable in ED with no significant deterioration in condition. Pt feels improved after observation and/or treatment in ED. Patient / Family / Caregiver understand and agree with initial ED impression and plan with expectations set for ED visit.  Patient agrees to return to ED for any worsening symptoms          Vidyuth Belsito L. Tahiri Shareef, [place] 01/06/12 1610

## 2012-01-06 NOTE — ED Notes (Signed)
Pt c/o lower back pain since Thursday. Has a history of back pain and states that he was mowing and weed eating prior to the pain starting.

## 2012-01-06 NOTE — ED Provider Notes (Signed)
Medical screening examination/treatment/procedure(s) were performed by non-physician practitioner and as supervising physician I was immediately available for consultation/collaboration.   Benny Lennert, MD 01/06/12 1321

## 2012-01-06 NOTE — Discharge Instructions (Signed)

## 2012-04-06 ENCOUNTER — Emergency Department (HOSPITAL_COMMUNITY): Payer: Self-pay

## 2012-04-06 ENCOUNTER — Emergency Department (HOSPITAL_COMMUNITY)
Admission: EM | Admit: 2012-04-06 | Discharge: 2012-04-07 | Disposition: A | Discharge status: Home / Self Care | Payer: Self-pay | Attending: Emergency Medicine | Admitting: Emergency Medicine

## 2012-04-06 ENCOUNTER — Encounter (HOSPITAL_COMMUNITY): Payer: Self-pay | Admitting: *Deleted

## 2012-04-06 DIAGNOSIS — R109 Unspecified abdominal pain: Secondary | ICD-10-CM | POA: Insufficient documentation

## 2012-04-06 DIAGNOSIS — N390 Urinary tract infection, site not specified: Secondary | ICD-10-CM

## 2012-04-06 DIAGNOSIS — N200 Calculus of kidney: Secondary | ICD-10-CM

## 2012-04-06 DIAGNOSIS — G35 Multiple sclerosis: Secondary | ICD-10-CM | POA: Insufficient documentation

## 2012-04-06 DIAGNOSIS — N201 Calculus of ureter: Secondary | ICD-10-CM | POA: Insufficient documentation

## 2012-04-06 LAB — CBC WITH DIFFERENTIAL/PLATELET
Basophils Absolute: 0 10*3/uL (ref 0.0–0.1)
Eosinophils Absolute: 0.1 10*3/uL (ref 0.0–0.7)
Lymphocytes Relative: 8 % — ABNORMAL LOW (ref 12–46)
Lymphs Abs: 0.5 10*3/uL — ABNORMAL LOW (ref 0.7–4.0)
Neutrophils Relative %: 83 % — ABNORMAL HIGH (ref 43–77)
Platelets: 239 10*3/uL (ref 150–400)
RBC: 3.72 MIL/uL — ABNORMAL LOW (ref 4.22–5.81)
RDW: 12.6 % (ref 11.5–15.5)
WBC: 6 10*3/uL (ref 4.0–10.5)

## 2012-04-06 LAB — URINALYSIS, ROUTINE W REFLEX MICROSCOPIC
Nitrite: POSITIVE — AB
Specific Gravity, Urine: >1.03 — ABNORMAL HIGH (ref 1.005–1.030)
Urobilinogen, UA: 1 mg/dL (ref 0.0–1.0)

## 2012-04-06 LAB — URINE MICROSCOPIC-ADD ON

## 2012-04-06 LAB — BASIC METABOLIC PANEL
CO2: 29 mEq/L (ref 19–32)
GFR calc non Af Amer: >90 mL/min (ref >90)
Glucose, Bld: 99 mg/dL (ref 70–99)
Potassium: 3.5 mEq/L (ref 3.5–5.1)
Sodium: 136 mEq/L (ref 135–145)

## 2012-04-06 MED ORDER — OXYCODONE-ACETAMINOPHEN 5-325 MG PO TABS: 2.0000 | ORAL_TABLET | ORAL | Status: DC | PRN

## 2012-04-06 MED ORDER — DEXTROSE 5 % IV SOLN
1.0000 g | Freq: Once | INTRAVENOUS | Status: AC
  Administered 2012-04-06: 1 g via INTRAVENOUS
  Filled 2012-04-06: qty 10

## 2012-04-06 MED ORDER — KETOROLAC TROMETHAMINE 30 MG/ML IJ SOLN
30.0000 mg | Freq: Once | INTRAMUSCULAR | Status: AC
Start: 2012-04-06 — End: 2012-04-06
  Administered 2012-04-06: 30 mg via INTRAVENOUS
  Filled 2012-04-06: qty 1

## 2012-04-06 MED ORDER — TAMSULOSIN HCL 0.4 MG PO CAPS: 0.4000 mg | ORAL_CAPSULE | Freq: Every day | ORAL | Status: DC

## 2012-04-06 MED ORDER — IBUPROFEN 800 MG PO TABS: 800.0000 mg | ORAL_TABLET | Freq: Three times a day (TID) | ORAL | Status: DC

## 2012-04-06 MED ORDER — ONDANSETRON HCL 4 MG PO TABS: 4.0000 mg | ORAL_TABLET | Freq: Four times a day (QID) | ORAL | Status: AC

## 2012-04-06 MED ORDER — ONDANSETRON HCL 4 MG/2ML IJ SOLN
4.0000 mg | Freq: Once | INTRAMUSCULAR | Status: AC
  Administered 2012-04-06: 4 mg via INTRAVENOUS
  Filled 2012-04-06: qty 2

## 2012-04-06 MED ORDER — SODIUM CHLORIDE 0.9 % IV BOLUS (SEPSIS)
1000.0000 mL | Freq: Once | INTRAVENOUS | Status: AC
  Administered 2012-04-06: 1000 mL via INTRAVENOUS

## 2012-04-06 NOTE — ED Notes (Signed)
Pt receiving iv antibiotics. Pt given sprite, graham crackers. And peanut butter.

## 2012-04-06 NOTE — ED Notes (Signed)
Pt c/o lower abdominal pain, left side flank and back pain. Pt also c/o blood in urine x 1 day. Pt also c/o nausea.

## 2012-04-06 NOTE — ED Notes (Signed)
Pt c/o lower abdominal pain that radiates to left flank and up left side x1 day. Pt also c/o hematuria x1 day.

## 2012-04-06 NOTE — ED Provider Notes (Signed)
History   This chart was scribed for Glynn Octave, MD scribed by Magnus Sinning. The patient was seen in room APA09/APA09 at 21:43   CSN: [id number]  Arrival date & time 04/06/12  1945   First MD Initiated Contact with Patient 04/06/12 2118      Chief Complaint  Patient presents with  . Abdominal Pain  . Back Pain  . Hematuria    (Consider location/radiation/quality/duration/timing/severity/associated sxs/prior treatment) Patient is a 46 y.o. male presenting with abdominal pain, back pain, and hematuria. The history is provided by the patient. No language interpreter was used.  Abdominal Pain  Additional symptoms associated with the illness include hematuria and back pain.  Back Pain   Hematuria   Sean Kemp is a 46 y.o. male who presents to the Emergency Department complaining of constant moderate sharp squeezing back pain with associated abd pain, left- sided flank pain, nausea, and hematuria onset yesterday morning. Denies hx of kidney stones, fevers, dysuria, or vomiting. Patient has hx of MS, dx'd a little over a year ago.  PCP:Dr. Delsa Grana  Past Medical History  Diagnosis Date  . Multiple sclerosis   . Back pain     Past Surgical History  Procedure Date  . Brain surgery   . Wrist surgery     History reviewed. No pertinent family history.  History  Substance Use Topics  . Smoking status: Current Everyday Smoker -- 1.0 packs/day    Types: Cigarettes  . Smokeless tobacco: Not on file  . Alcohol Use: No      Review of Systems  Genitourinary: Positive for hematuria.  Musculoskeletal: Positive for back pain.   10 Systems reviewed and are negative for acute change except as noted in the HPI. Allergies  Ultram  Home Medications   Current Outpatient Rx  Name Route Sig Dispense Refill  . PREDNISONE 10 MG PO TABS  Take 6 tablets day one, 5 tablets day two, 4 tablets day three, 3 tablets day four, 2 tablets day five, then 1 tablet day six 21  tablet 0    BP 140/85  Pulse 71  Temp 98.2 F (36.8 C) (Oral)  Resp 18  Ht 5\' 7"  (1.702 m)  Wt 120 lb (54.432 kg)  BMI 18.79 kg/m2  SpO2 97%  Physical Exam  Nursing note and vitals reviewed. Constitutional: He is oriented to person, place, and time. He appears well-developed and well-nourished. No distress.  HENT:  Head: Normocephalic and atraumatic.  Eyes: Conjunctivae and EOM are normal.  Neck: Neck supple. No tracheal deviation present.  Cardiovascular: Normal rate.   Pulmonary/Chest: Effort normal. No respiratory distress.  Abdominal: He exhibits no distension. There is tenderness.       Left CVA tenderness.  Genitourinary:       No testicular tenderness  Musculoskeletal: Normal range of motion.  Neurological: He is alert and oriented to person, place, and time. No sensory deficit.  Skin: Skin is dry.  Psychiatric: He has a normal mood and affect. His behavior is normal.    ED Course  Procedures (including critical care time) DIAGNOSTIC STUDIES: Oxygen Saturation is 97% on room air, normal by my interpretation.    COORDINATION OF CARE:   Labs Reviewed  URINALYSIS, ROUTINE W REFLEX MICROSCOPIC - Abnormal; Notable for the following:    Color, Urine BROWN (*)  BIOCHEMICALS MAY BE AFFECTED BY COLOR   APPearance CLOUDY (*)     Specific Gravity, Urine >1.030 (*)     Hgb urine dipstick LARGE (*)  Bilirubin Urine MODERATE (*)     Protein, ur 30 (*)     Nitrite POSITIVE (*)     All other components within normal limits  CBC WITH DIFFERENTIAL - Abnormal; Notable for the following:    RBC 3.72 (*)     HCT 38.0 (*)     MCV 102.2 (*)     MCH 36.8 (*)     MCHC 36.1 (*)     Neutrophils Relative 83 (*)     Lymphocytes Relative 8 (*)     Lymphs Abs 0.5 (*)     All other components within normal limits  URINE MICROSCOPIC-ADD ON - Abnormal; Notable for the following:    Bacteria, UA FEW (*)     All other components within normal limits  BASIC METABOLIC PANEL  URINE  CULTURE   Ct Abdomen Pelvis Wo Contrast  04/06/2012  *RADIOLOGY REPORT*  Clinical Data: Left-sided abdominal pain  CT ABDOMEN AND PELVIS WITHOUT CONTRAST  Technique:  Multidetector CT imaging of the abdomen and pelvis was performed following the standard protocol without intravenous contrast.  Comparison: 10/18/2010  Findings: Limited images through the lung bases demonstrate no significant appreciable abnormality. The heart size is within normal limits. No pleural or pericardial effusion.  Organ abnormality/lesion detection is limited in the absence of intravenous contrast. Within this limitation, unremarkable liver, biliary system, spleen, pancreas, adrenal glands.  There are nonobstructing stones within each kidney.  1.4 cm interpolar cyst on the left.  There is mild hydroureteronephrosis to the level of a 4 mm stone at the left UPJ.  No bowel obstruction.  Nonspecific fecalized loops of small bowel suggests delayed transit.  No CT evidence for colitis.  Normal appendix.  No free intraperitoneal air or fluid.  No lymphadenopathy.  Partially decompressed bladder.  Prostatomegaly.  Normal caliber aorta and branch vessels.  SI joint degenerative changes.  Right sacral bone island. Multilevel degenerative changes of the thoracolumbar spine. Sequelae of prior L5 compression fracture with superior endplate irregularity/Schmorl's node.  No acute osseous finding.  IMPRESSION: Mild left hydroureteronephrosis to the level of a 4 mm left UPJ stone.  Additional bilateral nonobstructing renal stones.  1.4 cm interpolar cyst on the left.  Prostatomegaly.  Fecalized loops of small bowel suggests delayed transit.  No overt evidence for obstruction.   Original Report Authenticated By: Waneta Martins, M.D.      No diagnosis found.    MDM  Left sided flank pain with hematuria x 1 day.  No history of kidney stones.    CT scan shows proximal 4 mm stone in the left with mild hydronephrosis. Urinalysis shows positive  nitrate, 7 at 10 whites, too numerous to count reds.  Results of infected stone discussed with Dr. Jerre Simon. He recommends starting antibiotics and sending a urine culture. He'll see the patient on Monday morning. He states as the patient's pain is well-controlled he does not need to be admitted despite the infection.  Patient's pain is well-controlled and he states be able to manage at home. He is afebrile and nontoxic appearing. Return precautions discussed.  I personally performed the services described in this documentation, which was scribed in my presence.  The recorded information has been reviewed and considered.       Glynn Octave, MD 04/06/12 2340

## 2012-04-07 MED ORDER — CIPROFLOXACIN HCL 500 MG PO TABS: 500.0000 mg | ORAL_TABLET | Freq: Two times a day (BID) | ORAL | Status: DC

## 2012-04-07 NOTE — ED Notes (Signed)
Pt alert & oriented x4, stable gait. Patient given discharge instructions, paperwork & prescription(s). Patient  instructed to stop at the registration desk to finish any additional paperwork. Patient verbalized understanding. Pt left department w/ no further questions. 

## 2012-04-09 LAB — URINE CULTURE

## 2012-04-17 ENCOUNTER — Emergency Department (HOSPITAL_COMMUNITY): Payer: Self-pay

## 2012-04-17 ENCOUNTER — Emergency Department (HOSPITAL_COMMUNITY)
Admission: EM | Admit: 2012-04-17 | Discharge: 2012-04-18 | Disposition: A | Discharge status: Home / Self Care | Payer: Self-pay | Attending: Emergency Medicine | Admitting: Emergency Medicine

## 2012-04-17 ENCOUNTER — Encounter (HOSPITAL_COMMUNITY): Payer: Self-pay | Admitting: *Deleted

## 2012-04-17 DIAGNOSIS — F17200 Nicotine dependence, unspecified, uncomplicated: Secondary | ICD-10-CM | POA: Insufficient documentation

## 2012-04-17 DIAGNOSIS — G35 Multiple sclerosis: Secondary | ICD-10-CM | POA: Insufficient documentation

## 2012-04-17 DIAGNOSIS — R404 Transient alteration of awareness: Secondary | ICD-10-CM | POA: Insufficient documentation

## 2012-04-17 DIAGNOSIS — R42 Dizziness and giddiness: Secondary | ICD-10-CM | POA: Insufficient documentation

## 2012-04-17 DIAGNOSIS — Z79899 Other long term (current) drug therapy: Secondary | ICD-10-CM | POA: Insufficient documentation

## 2012-04-17 DIAGNOSIS — R55 Syncope and collapse: Secondary | ICD-10-CM | POA: Insufficient documentation

## 2012-04-17 DIAGNOSIS — M542 Cervicalgia: Secondary | ICD-10-CM | POA: Insufficient documentation

## 2012-04-17 DIAGNOSIS — F1910 Other psychoactive substance abuse, uncomplicated: Secondary | ICD-10-CM

## 2012-04-17 DIAGNOSIS — N390 Urinary tract infection, site not specified: Secondary | ICD-10-CM | POA: Insufficient documentation

## 2012-04-17 DIAGNOSIS — M549 Dorsalgia, unspecified: Secondary | ICD-10-CM | POA: Insufficient documentation

## 2012-04-17 DIAGNOSIS — R079 Chest pain, unspecified: Secondary | ICD-10-CM | POA: Insufficient documentation

## 2012-04-17 LAB — URINALYSIS, ROUTINE W REFLEX MICROSCOPIC
Leukocytes, UA: NEGATIVE
Nitrite: NEGATIVE
Specific Gravity, Urine: >1.03 — ABNORMAL HIGH (ref 1.005–1.030)
Urobilinogen, UA: 0.2 mg/dL (ref 0.0–1.0)
pH: 5.5 (ref 5.0–8.0)

## 2012-04-17 LAB — CBC
HCT: 37.9 % — ABNORMAL LOW (ref 39.0–52.0)
Hemoglobin: 13.4 g/dL (ref 13.0–17.0)
MCV: 103.3 fL — ABNORMAL HIGH (ref 78.0–100.0)
RDW: 12.6 % (ref 11.5–15.5)
WBC: 5.8 10*3/uL (ref 4.0–10.5)

## 2012-04-17 LAB — BASIC METABOLIC PANEL
BUN: 12 mg/dL (ref 6–23)
CO2: 31 mEq/L (ref 19–32)
Chloride: 103 mEq/L (ref 96–112)
Creatinine, Ser: 0.94 mg/dL (ref 0.50–1.35)
Glucose, Bld: 102 mg/dL — ABNORMAL HIGH (ref 70–99)
Potassium: 3.6 mEq/L (ref 3.5–5.1)

## 2012-04-17 LAB — RAPID URINE DRUG SCREEN, HOSP PERFORMED
Barbiturates: NOT DETECTED
Cocaine: POSITIVE — AB
Tetrahydrocannabinol: POSITIVE — AB

## 2012-04-17 LAB — URINE MICROSCOPIC-ADD ON

## 2012-04-17 LAB — ETHANOL: Alcohol, Ethyl (B): <11 mg/dL (ref 0–11)

## 2012-04-17 MED ORDER — SODIUM CHLORIDE 0.9 % IV BOLUS (SEPSIS)
1000.0000 mL | Freq: Once | INTRAVENOUS | Status: AC
Start: 2012-04-17 — End: 2012-04-17
  Administered 2012-04-17: 1000 mL via INTRAVENOUS
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
## 2012-04-18 MED ORDER — SULFAMETHOXAZOLE-TRIMETHOPRIM 800-160 MG PO TABS: 1.0000 | ORAL_TABLET | Freq: Two times a day (BID) | ORAL | Status: AC

## 2012-04-18 NOTE — ED Notes (Signed)
Returned from CT  - patient requested food, something to drink and some Xanax.  States he is hungry and he needs something for his nerves

## 2012-04-18 NOTE — ED Notes (Signed)
In to discharge this patient, IV removed, reviewed discharge instructions, advised to call his MD in am for appt tomorrow. Patient is trying to find a ride home from here - on his cellphone with someone.  Now patient states he wants to know why we did not xray his ribs, hurts when he breathes and with movement.  Advised to follow up with his MD tomorrow.  Patient verbalized understanding of dc instructions and need to get antibiotic filled.  Requesting something to drink - water offered.  MD advised of patients co pain in ribs.

## 2012-04-18 NOTE — ED Notes (Signed)
Also given graham crackers.  EDP agrees with patient following up with his MD tomorrow.

## 2012-10-08 ENCOUNTER — Encounter (HOSPITAL_COMMUNITY): Payer: Self-pay

## 2012-10-08 ENCOUNTER — Emergency Department (HOSPITAL_COMMUNITY): Payer: Self-pay

## 2012-10-08 ENCOUNTER — Emergency Department (HOSPITAL_COMMUNITY)
Admission: EM | Admit: 2012-10-08 | Discharge: 2012-10-08 | Disposition: A | Discharge status: Home / Self Care | Payer: Self-pay | Attending: Emergency Medicine | Admitting: Emergency Medicine

## 2012-10-08 DIAGNOSIS — Z8739 Personal history of other diseases of the musculoskeletal system and connective tissue: Secondary | ICD-10-CM | POA: Insufficient documentation

## 2012-10-08 DIAGNOSIS — Y9389 Activity, other specified: Secondary | ICD-10-CM | POA: Insufficient documentation

## 2012-10-08 DIAGNOSIS — Z8669 Personal history of other diseases of the nervous system and sense organs: Secondary | ICD-10-CM | POA: Insufficient documentation

## 2012-10-08 DIAGNOSIS — Z79899 Other long term (current) drug therapy: Secondary | ICD-10-CM | POA: Insufficient documentation

## 2012-10-08 DIAGNOSIS — W1809XA Striking against other object with subsequent fall, initial encounter: Secondary | ICD-10-CM | POA: Insufficient documentation

## 2012-10-08 DIAGNOSIS — S20212A Contusion of left front wall of thorax, initial encounter: Secondary | ICD-10-CM

## 2012-10-08 DIAGNOSIS — S20219A Contusion of unspecified front wall of thorax, initial encounter: Secondary | ICD-10-CM | POA: Insufficient documentation

## 2012-10-08 DIAGNOSIS — F17200 Nicotine dependence, unspecified, uncomplicated: Secondary | ICD-10-CM | POA: Insufficient documentation

## 2012-10-08 DIAGNOSIS — Y929 Unspecified place or not applicable: Secondary | ICD-10-CM | POA: Insufficient documentation

## 2012-10-08 MED ORDER — HYDROCODONE-ACETAMINOPHEN 5-325 MG PO TABS: 2.0000 | ORAL_TABLET | ORAL | Status: DC | PRN

## 2012-10-08 MED ORDER — IBUPROFEN 800 MG PO TABS: 800.0000 mg | ORAL_TABLET | Freq: Three times a day (TID) | ORAL | Status: DC

## 2012-10-08 NOTE — ED Notes (Signed)
Denies any SOB. 

## 2012-10-08 NOTE — Discharge Instructions (Signed)
Chest Contusion You have been checked for injuries to your chest. Your caregiver has not found injuries serious enough to require hospitalization. It is common to have bruises and sore muscles after an injury. These tend to feel worse the first 24 hours. You may gradually develop more stiffness and soreness over the next several hours to several days. This usually feels worse the first morning following your injury. After a few days, you will usually begin to improve. The amount of improvement depends on the amount of damage. Following the accident, if the pain in any area continues to increase or you develop new areas of pain, you should see your primary caregiver or return to the Emergency Department for re-evaluation. HOME CARE INSTRUCTIONS   Put ice on sore areas every 2 hours for 20 minutes while awake for the next 2 days.  Drink extra fluids. Do not drink alcohol.  Activity as tolerated. Lifting may make pain worse.  Only take over-the-counter or prescription medicines for pain, discomfort, or fever as directed by your caregiver. Do not use aspirin. This may increase bruising or increase bleeding. SEEK IMMEDIATE MEDICAL CARE IF:   There is a worsening of any of the problems that brought you in for care.  Shortness of breath, dizziness or fainting develop.  You have chest pain, difficulty breathing, or develop pain going down the left arm or up into jaw.  You feel sick to your stomach (nausea), vomiting or sweats.  You have increasing belly (abdominal) discomfort.  There is blood in your urine, stool, or if you vomit blood.  There is pain in either shoulder in an area where a shoulder strap would be.  You have feelings of lightheadedness, or if you should have a fainting episode.  You have numbness, tingling, weakness, or problems with the use of your arms or legs.  Severe headaches not relieved with medications develop.  You have a change in bowel or bladder control.  There  is increasing pain in any areas of the body. If you feel your symptoms are worsening, and you are not able to see your primary caregiver, return to the Emergency Department immediately. MAKE SURE YOU:   Understand these instructions.  Will watch your condition.  Will get help right away if you are not doing well or get worse. Document Released: 04/25/2001 Document Revised: 10/23/2011 Document Reviewed: 03/18/2008 [employer] Patient Information 2013 [employer].  

## 2012-10-08 NOTE — ED Notes (Signed)
Pt reports fell on a wood pile a few days ago.  C/O pain in left ribs.

## 2012-10-08 NOTE — ED Provider Notes (Signed)
History     This chart was scribed for Glynn Octave, MD, MD by Smitty Pluck, ED Scribe. The patient was seen in room APFT22/APFT22 and the patient's care was started at 11:33 AM.   CSN: [id number]  Arrival date & time 10/08/12  1105   None     Chief Complaint  Patient presents with  . Rib Injury    (Consider location/radiation/quality/duration/timing/severity/associated sxs/prior treatment) The history is provided by the patient. No language interpreter was used.   Sean Kemp is a 47 y.o. male who presents to the Emergency Department complaining of constant, moderate left lower rib pain with sudden onset 2 days ago. He states he was moving wood and fell on wood causing the rib pain. Pain is aggravated by movement. Pt has taken BC powder and Aleve without relief of pain. Pt denies LOC, head injury, back pain, neck pain, fever, chills, nausea, vomiting, diarrhea, weakness, cough, SOB and any other pain.   Past Medical History  Diagnosis Date  . Multiple sclerosis   . Back pain     Past Surgical History  Procedure Laterality Date  . Brain surgery    . Wrist surgery      No family history on file.  History  Substance Use Topics  . Smoking status: Current Every Day Smoker -- 1.00 packs/day    Types: Cigarettes  . Smokeless tobacco: Not on file  . Alcohol Use: No      Review of Systems 10 Systems reviewed and all are negative for acute change except as noted in the HPI.   Allergies  Ultram  Home Medications   Current Outpatient Rx  Name  Route  Sig  Dispense  Refill  . ALPRAZolam (XANAX) 0.5 MG tablet   Oral   Take 0.5 mg by mouth 3 (three) times daily as needed. nerves         . Fingolimod HCl (GILENYA) 0.5 MG CAPS   Oral   Take 0.5 mg by mouth daily.         . naproxen sodium (ALEVE) 220 MG tablet   Oral   Take 440 mg by mouth daily as needed (pain).         Marland Kitchen HYDROcodone-acetaminophen (NORCO/VICODIN) 5-325 MG per tablet   Oral  Take 2 tablets by mouth every 4 (four) hours as needed for pain.   10 tablet   0   . ibuprofen (ADVIL,MOTRIN) 800 MG tablet   Oral   Take 1 tablet (800 mg total) by mouth 3 (three) times daily.   21 tablet   0     BP 140/88  Pulse 63  Temp(Src) 97.6 F (36.4 C) (Oral)  Resp 18  Ht 5\' 8"  (1.727 m)  Wt 125 lb (56.7 kg)  BMI 19.01 kg/m2  SpO2 100%  Physical Exam  Nursing note and vitals reviewed. Constitutional: He is oriented to person, place, and time. He appears well-developed and well-nourished. No distress.  HENT:  Head: Normocephalic and atraumatic.  Eyes: EOM are normal. Pupils are equal, round, and reactive to light.  Neck: Normal range of motion. Neck supple. No tracheal deviation present.  Cardiovascular: Normal rate.   Pulmonary/Chest: Effort normal. No respiratory distress.  Tender to palpation left lower ribs No crepitus  No bruising    Abdominal: Soft. He exhibits no distension. There is no tenderness. There is no rebound and no guarding.  Musculoskeletal: Normal range of motion.  Neurological: He is alert and oriented to person, place, and time.  Skin: Skin is warm and dry.  Psychiatric: He has a normal mood and affect. His behavior is normal.    ED Course  Procedures (including critical care time) DIAGNOSTIC STUDIES: Oxygen Saturation is 100% on room air, normal by my interpretation.    COORDINATION OF CARE: 11:36 AM Discussed ED treatment with pt and pt agrees.     Labs Reviewed - No data to display Dg Ribs Unilateral W/chest Left  10/08/2012  *RADIOLOGY REPORT*  Clinical Data: Chest pain  LEFT RIBS AND CHEST - 3+ VIEW  Comparison: 10/26/2010  Findings: The heart size appears within normal limits.  There is no pleural effusion or edema.  Lung volumes are normal.  Dedicated views of the left ribs show no displaced rib fracture.  IMPRESSION:  1.  No rib deformities identified. 2.  No acute cardiopulmonary abnormalities.   Original Report Authenticated  By: Signa Kell, M.D.      1. Rib contusion, left, initial encounter       MDM  Mechanical fall 2 days ago onto a wood pile not complaining of pain in the left ribs. There was no loss consciousness. Pain with inspiration but no shortness of breath.  Xray negative for fracture. Abdomen soft nontender. Fast ultrasound negative. No hypoxia, tachycardia or hypotension. No distress. We'll treat as rib contusion followup with PCP. Return precautions discussed   EMERGENCY DEPARTMENT [place] FAST EXAM  INDICATIONS:Blunt trauma to the Thorax  PERFORMED BY: Myself  IMAGES ARCHIVED?: No  FINDINGS: All views negative  LIMITATIONS:    INTERPRETATION:  No abdominal free fluid and No pericardial effusion  COMMENT:     I personally performed the services described in this documentation, which was scribed in my presence. The recorded information has been reviewed and is accurate.       Glynn Octave, MD 10/08/12 1349

## 2012-11-13 ENCOUNTER — Telehealth: Payer: Self-pay | Admitting: Neurology

## 2012-11-13 NOTE — Telephone Encounter (Addendum)
I called and spoke to pt and he took last dose of gilenya on 11-11-12.  He has had 2 wk lapse of gilenya previously.  He did state that he has called PAP with [employer] and they have stated that they needed clinic information from [place].  Toniann Fail, RN with [employer] has tried to touch base with pt multiple times and no return.  Last refill from [place] in January.  (pt thought February).  I know he did have transition from study to clinic side and had been given samples to get thru.  I tried to call pap [phone] and no answer. LMVM for Rozell Searing re: above.  He is out of medication as of 11/11/12.  They at [facility] have closed the case.

## 2012-11-14 NOTE — Telephone Encounter (Signed)
I spoke with Rozell Searing from [employer].  She did talk with [facility] and also PAP on this pt, and she relayed that pt was aware of needing financial information from him and not from clinic.  Toniann Fail, RN had spoken to pt about this.  FYI

## 2012-11-15 NOTE — Telephone Encounter (Signed)
Talbert Forest and Shanda Bumps:  Would you please help,  I want him to be back on Gilenya again. Thanks.  Annabelle Harman, give him a follow up with Eber Jones or me on next available.

## 2012-11-18 ENCOUNTER — Other Ambulatory Visit: Payer: Self-pay

## 2012-11-18 MED ORDER — FINGOLIMOD HCL 0.5 MG PO CAPS: 0.5000 mg | ORAL_CAPSULE | Freq: Every day | ORAL | Status: DC

## 2012-11-18 NOTE — Addendum Note (Signed)
Addended by: Lucille Passy C on: 11/18/2012 08:31 AM   Modules accepted: Orders

## 2012-11-18 NOTE — Telephone Encounter (Signed)
Rx did not print.  I had to reorder so it could be reprinted.  JCB

## 2012-11-18 NOTE — Telephone Encounter (Signed)
By viewing previous note, it appears the [facility] Go [facility] is still pending financial info from patient.  (They have had trouble reaching this patient for several months, this has been an ongoing Dealer notes as well).  I called patient, got no answer.  Left message.  Left number for [facility] in message so he can call them to go over info they need from him.  In the meantime, we can print a new rx to send to [facility] so they will have it on file to process his meds without delay once they have info they need from patient.

## 2012-12-31 ENCOUNTER — Encounter (HOSPITAL_COMMUNITY): Payer: Self-pay | Admitting: Emergency Medicine

## 2012-12-31 ENCOUNTER — Emergency Department (HOSPITAL_COMMUNITY)
Admission: EM | Admit: 2012-12-31 | Discharge: 2012-12-31 | Disposition: A | Discharge status: Home / Self Care | Payer: Medicare Other | Attending: Emergency Medicine | Admitting: Emergency Medicine

## 2012-12-31 DIAGNOSIS — Y9389 Activity, other specified: Secondary | ICD-10-CM | POA: Insufficient documentation

## 2012-12-31 DIAGNOSIS — F17200 Nicotine dependence, unspecified, uncomplicated: Secondary | ICD-10-CM | POA: Insufficient documentation

## 2012-12-31 DIAGNOSIS — R51 Headache: Secondary | ICD-10-CM | POA: Insufficient documentation

## 2012-12-31 DIAGNOSIS — S80862A Insect bite (nonvenomous), left lower leg, initial encounter: Secondary | ICD-10-CM

## 2012-12-31 DIAGNOSIS — Z8669 Personal history of other diseases of the nervous system and sense organs: Secondary | ICD-10-CM | POA: Insufficient documentation

## 2012-12-31 DIAGNOSIS — Z8739 Personal history of other diseases of the musculoskeletal system and connective tissue: Secondary | ICD-10-CM | POA: Insufficient documentation

## 2012-12-31 DIAGNOSIS — S90569A Insect bite (nonvenomous), unspecified ankle, initial encounter: Secondary | ICD-10-CM | POA: Insufficient documentation

## 2012-12-31 DIAGNOSIS — Z79899 Other long term (current) drug therapy: Secondary | ICD-10-CM | POA: Insufficient documentation

## 2012-12-31 DIAGNOSIS — Y929 Unspecified place or not applicable: Secondary | ICD-10-CM | POA: Insufficient documentation

## 2012-12-31 MED ORDER — HYDROCODONE-ACETAMINOPHEN 5-325 MG PO TABS: 1.0000 | ORAL_TABLET | ORAL | Status: DC | PRN

## 2012-12-31 MED ORDER — DOXYCYCLINE HYCLATE 100 MG PO TABS: 100.0000 mg | ORAL_TABLET | Freq: Two times a day (BID) | ORAL | Status: DC

## 2012-12-31 MED ORDER — DOXYCYCLINE HYCLATE 100 MG PO TABS
100.0000 mg | ORAL_TABLET | Freq: Once | ORAL | Status: AC
  Administered 2012-12-31: 100 mg via ORAL
  Filled 2012-12-31: qty 1

## 2012-12-31 NOTE — ED Provider Notes (Signed)
History     CSN: [id number]  Arrival date & time 12/31/12  0950   First MD Initiated Contact with Patient 12/31/12 1001      Chief Complaint  Patient presents with  . Tick Removal    (Consider location/radiation/quality/duration/timing/severity/associated sxs/prior treatment) HPI Comments: Sean Kemp is a 47 y.o. Male presenting with tick bite exposure.  He removed an engorged deer tick which popped from his left lower leg yesterday,  And since then the site has become itchy but also tender with surrounding redness.  He also describes mild headache,  But denies fevers, chills, joint or body aches and has had no rash.  He has taken naproxen with no relief from the localize pain which he describes as throbbing and constant, nonradiating.     The history is provided by the patient.    Past Medical History  Diagnosis Date  . Multiple sclerosis   . Back pain     Past Surgical History  Procedure Laterality Date  . Brain surgery    . Wrist surgery      No family history on file.  History  Substance Use Topics  . Smoking status: Current Every Day Smoker -- 1.00 packs/day    Types: Cigarettes  . Smokeless tobacco: Not on file  . Alcohol Use: No      Review of Systems  Constitutional: Negative for fever and chills.  HENT: Negative for facial swelling.   Respiratory: Negative for shortness of breath and wheezing.   Skin: Positive for color change and wound.  Neurological: Positive for headaches. Negative for numbness.    Allergies  Ultram  Home Medications   Current Outpatient Rx  Name  Route  Sig  Dispense  Refill  . ALPRAZolam (XANAX) 0.5 MG tablet   Oral   Take 0.5 mg by mouth 3 (three) times daily as needed. nerves         . doxycycline (VIBRA-TABS) 100 MG tablet   Oral   Take 1 tablet (100 mg total) by mouth 2 (two) times daily.   27 tablet   0   . Fingolimod HCl (GILENYA) 0.5 MG CAPS   Oral   Take 1 capsule (0.5 mg total) by mouth  daily.   90 capsule   3     FDO 10/11/2011   . naproxen sodium (ALEVE) 220 MG tablet   Oral   Take 220 mg by mouth daily as needed (pain).            BP 130/90  Pulse 64  Temp(Src) 98.5 F (36.9 C)  Resp 18  Ht 5\' 7"  (1.702 m)  Wt 140 lb (63.504 kg)  BMI 21.92 kg/m2  SpO2 100%  Physical Exam  Constitutional: He appears well-developed and well-nourished. No distress.  HENT:  Head: Normocephalic.  Neck: Neck supple.  Cardiovascular: Normal rate.   Pulmonary/Chest: Effort normal. He has no wheezes.  Musculoskeletal: Normal range of motion. He exhibits no edema.  Skin: There is erythema.  Abraded appearing scab left mid anterior lower leg with surrounding erythema 1 cm diameter.  No red streaking.  No obvious retained tick.      ED Course  Procedures (including critical care time)  Labs Reviewed - No data to display No results found.   1. Tick bite of lower leg, left, initial encounter       MDM  Pt was prescribed doxycyline,  Encouraged warm soaks of wound,  Avoid rubbing,  Scratching.  Recheck by pcp or  return here for worsen symptoms which were discussed with patient.  Prior to discharge patient requested pain medication as Naprosyn was not relieving his symptoms.  Prescribed #10 hydrocodone.      Burgess Amor, PA-C 12/31/12 1111

## 2012-12-31 NOTE — ED Notes (Signed)
Pt c/o tick bite to left lower leg.

## 2013-01-02 NOTE — ED Provider Notes (Signed)
Medical screening examination/treatment/procedure(s) were performed by non-physician practitioner and as supervising physician I was immediately available for consultation/collaboration.   Jeferson Boozer J. Rayleigh Gillyard, MD 01/02/13 2320 

## 2013-04-07 ENCOUNTER — Encounter (HOSPITAL_COMMUNITY): Payer: Self-pay | Admitting: *Deleted

## 2013-04-07 ENCOUNTER — Emergency Department (HOSPITAL_COMMUNITY)
Admission: EM | Admit: 2013-04-07 | Discharge: 2013-04-07 | Disposition: A | Discharge status: Home / Self Care | Payer: Medicare Other | Attending: Emergency Medicine | Admitting: Emergency Medicine

## 2013-04-07 DIAGNOSIS — M545 Low back pain, unspecified: Secondary | ICD-10-CM | POA: Insufficient documentation

## 2013-04-07 DIAGNOSIS — Z9889 Other specified postprocedural states: Secondary | ICD-10-CM | POA: Insufficient documentation

## 2013-04-07 DIAGNOSIS — F17200 Nicotine dependence, unspecified, uncomplicated: Secondary | ICD-10-CM | POA: Insufficient documentation

## 2013-04-07 DIAGNOSIS — Z8669 Personal history of other diseases of the nervous system and sense organs: Secondary | ICD-10-CM | POA: Insufficient documentation

## 2013-04-07 DIAGNOSIS — Z79899 Other long term (current) drug therapy: Secondary | ICD-10-CM | POA: Insufficient documentation

## 2013-04-07 MED ORDER — DICLOFENAC SODIUM 75 MG PO TBEC: 75.0000 mg | DELAYED_RELEASE_TABLET | Freq: Two times a day (BID) | ORAL | Status: DC

## 2013-04-07 MED ORDER — METHOCARBAMOL 500 MG PO TABS: 500.0000 mg | ORAL_TABLET | Freq: Three times a day (TID) | ORAL | Status: DC

## 2013-04-07 MED ORDER — DEXAMETHASONE 6 MG PO TABS: ORAL_TABLET | ORAL | Status: DC

## 2013-04-07 NOTE — ED Notes (Signed)
Low back pain when working in yard.

## 2013-04-07 NOTE — ED Provider Notes (Signed)
CSN: [id number]     Arrival date & time 04/07/13  1754 History   First MD Initiated Contact with Patient 04/07/13 1835     Chief Complaint  Patient presents with  . Back Pain   (Consider location/radiation/quality/duration/timing/severity/associated sxs/prior Treatment) Patient is a 47 y.o. male presenting with back pain. The history is provided by the patient.  Back Pain Location:  Lumbar spine Quality:  Aching Pain severity:  Moderate Pain is:  Same all the time Onset quality:  Gradual Duration:  1 day Timing:  Constant Progression:  Worsening Chronicity:  Recurrent Context comment:  Raking leaves and cutting grass Relieved by:  Nothing Worsened by:  Movement Ineffective treatments:  NSAIDs Associated symptoms: no abdominal pain, no bladder incontinence, no bowel incontinence, no chest pain, no dysuria and no perianal numbness     Past Medical History  Diagnosis Date  . Multiple sclerosis   . Back pain    Past Surgical History  Procedure Laterality Date  . Wrist surgery    . Brain surgery     History reviewed. No pertinent family history. History  Substance Use Topics  . Smoking status: Current Every Day Smoker -- 1.00 packs/day    Types: Cigarettes  . Smokeless tobacco: Not on file  . Alcohol Use: No    Review of Systems  Constitutional: Negative for activity change.       All ROS Neg except as noted in HPI  HENT: Negative for nosebleeds and neck pain.   Eyes: Negative for photophobia and discharge.  Respiratory: Negative for cough, shortness of breath and wheezing.   Cardiovascular: Negative for chest pain and palpitations.  Gastrointestinal: Negative for abdominal pain, blood in stool and bowel incontinence.  Genitourinary: Negative for bladder incontinence, dysuria, frequency and hematuria.  Musculoskeletal: Positive for back pain. Negative for arthralgias.  Skin: Negative.   Neurological: Negative for dizziness, seizures and speech difficulty.   Psychiatric/Behavioral: Negative for hallucinations and confusion.    Allergies  Ultram  Home Medications   Current Outpatient Rx  Name  Route  Sig  Dispense  Refill  . ALPRAZolam (XANAX) 0.5 MG tablet   Oral   Take 0.5 mg by mouth 3 (three) times daily as needed. nerves         . doxycycline (VIBRA-TABS) 100 MG tablet   Oral   Take 1 tablet (100 mg total) by mouth 2 (two) times daily.   27 tablet   0   . Fingolimod HCl (GILENYA) 0.5 MG CAPS   Oral   Take 1 capsule (0.5 mg total) by mouth daily.   90 capsule   3     FDO 10/11/2011   . HYDROcodone-acetaminophen (NORCO/VICODIN) 5-325 MG per tablet   Oral   Take 1 tablet by mouth every 4 (four) hours as needed for pain.   10 tablet   0   . naproxen sodium (ALEVE) 220 MG tablet   Oral   Take 220 mg by mouth daily as needed (pain).           BP 113/68  Pulse 71  Temp(Src) 98.2 F (36.8 C) (Oral)  Resp 20  Ht 5\' 8"  (1.727 m)  Wt 125 lb (56.7 kg)  BMI 19.01 kg/m2  SpO2 99% Physical Exam  Nursing note and vitals reviewed. Constitutional: He is oriented to person, place, and time. He appears well-developed and well-nourished.  Non-toxic appearance.  HENT:  Head: Normocephalic.  Right Ear: Tympanic membrane and external ear normal.  Left Ear: Tympanic  membrane and external ear normal.  Eyes: EOM and lids are normal. Pupils are equal, round, and reactive to light.  Neck: Normal range of motion. Neck supple. Carotid bruit is not present.  Cardiovascular: Normal rate, regular rhythm, normal heart sounds, intact distal pulses and normal pulses.   Pulmonary/Chest: Breath sounds normal. No respiratory distress.  Abdominal: Soft. Bowel sounds are normal. There is no tenderness. There is no guarding.  Musculoskeletal: Normal range of motion.  Pain at the lumbar area with attempted ROM. No palpable step off. No hot area.  Lymphadenopathy:       Head (right side): No submandibular adenopathy present.       Head (left  side): No submandibular adenopathy present.    He has no cervical adenopathy.  Neurological: He is alert and oriented to person, place, and time. He has normal strength. No cranial nerve deficit or sensory deficit. He exhibits normal muscle tone. Coordination normal.  Skin: Skin is warm and dry.  Psychiatric: He has a normal mood and affect. His speech is normal.    ED Course  Procedures (including critical care time) Labs Review Labs Reviewed - No data to display Imaging Review No results found.  MDM  No diagnosis found. **I have reviewed nursing notes, vital signs, and all appropriate lab and imaging results for this patient.*  Pt has hx of back pain. Pt was walking in the yard and re-injured the lower back. Rx for decadron, voltaren, and robaxin given to the patient.  Kathie Dike, PA-C 04/10/13 [phone]

## 2013-04-10 NOTE — ED Provider Notes (Signed)
Medical screening examination/treatment/procedure(s) were performed by non-physician practitioner and as supervising physician I was immediately available for consultation/collaboration.   Sathvika Ojo L Elfreda Blanchet, MD 04/10/13 1305 

## 2013-09-24 ENCOUNTER — Other Ambulatory Visit (HOSPITAL_COMMUNITY): Payer: Self-pay | Admitting: Family Medicine

## 2013-09-24 ENCOUNTER — Ambulatory Visit (HOSPITAL_COMMUNITY)
Admission: RE | Admit: 2013-09-24 | Discharge: 2013-09-24 | Disposition: A | Discharge status: Home / Self Care | Payer: Medicare Other | Source: Ambulatory Visit | Attending: Family Medicine | Admitting: Family Medicine

## 2013-09-24 DIAGNOSIS — R05 Cough: Secondary | ICD-10-CM

## 2013-09-24 DIAGNOSIS — M549 Dorsalgia, unspecified: Secondary | ICD-10-CM

## 2013-09-24 DIAGNOSIS — R059 Cough, unspecified: Secondary | ICD-10-CM

## 2013-09-24 DIAGNOSIS — F17200 Nicotine dependence, unspecified, uncomplicated: Secondary | ICD-10-CM

## 2013-09-24 DIAGNOSIS — M818 Other osteoporosis without current pathological fracture: Secondary | ICD-10-CM | POA: Insufficient documentation

## 2013-09-24 DIAGNOSIS — M545 Low back pain, unspecified: Secondary | ICD-10-CM | POA: Insufficient documentation

## 2013-09-24 DIAGNOSIS — R209 Unspecified disturbances of skin sensation: Secondary | ICD-10-CM | POA: Insufficient documentation

## 2013-10-29 ENCOUNTER — Other Ambulatory Visit (HOSPITAL_COMMUNITY): Payer: Self-pay | Admitting: Family Medicine

## 2013-10-29 DIAGNOSIS — M545 Low back pain: Secondary | ICD-10-CM

## 2013-10-29 DIAGNOSIS — M79604 Pain in right leg: Secondary | ICD-10-CM

## 2013-11-03 ENCOUNTER — Ambulatory Visit (HOSPITAL_COMMUNITY): Payer: Medicare Other | Attending: Family Medicine

## 2013-12-02 ENCOUNTER — Ambulatory Visit (HOSPITAL_COMMUNITY): Payer: Medicare Other

## 2014-02-08 ENCOUNTER — Emergency Department (HOSPITAL_COMMUNITY)
Admission: EM | Admit: 2014-02-08 | Discharge: 2014-02-08 | Disposition: A | Discharge status: Home / Self Care | Payer: Medicare Other | Attending: Emergency Medicine | Admitting: Emergency Medicine

## 2014-02-08 ENCOUNTER — Encounter (HOSPITAL_COMMUNITY): Payer: Self-pay | Admitting: Emergency Medicine

## 2014-02-08 ENCOUNTER — Emergency Department (HOSPITAL_COMMUNITY): Payer: Medicare Other

## 2014-02-08 DIAGNOSIS — G8929 Other chronic pain: Secondary | ICD-10-CM | POA: Insufficient documentation

## 2014-02-08 DIAGNOSIS — M545 Low back pain, unspecified: Secondary | ICD-10-CM | POA: Insufficient documentation

## 2014-02-08 DIAGNOSIS — R209 Unspecified disturbances of skin sensation: Secondary | ICD-10-CM | POA: Insufficient documentation

## 2014-02-08 DIAGNOSIS — Z8669 Personal history of other diseases of the nervous system and sense organs: Secondary | ICD-10-CM | POA: Insufficient documentation

## 2014-02-08 DIAGNOSIS — Z791 Long term (current) use of non-steroidal anti-inflammatories (NSAID): Secondary | ICD-10-CM | POA: Insufficient documentation

## 2014-02-08 DIAGNOSIS — Z79899 Other long term (current) drug therapy: Secondary | ICD-10-CM | POA: Insufficient documentation

## 2014-02-08 DIAGNOSIS — F17200 Nicotine dependence, unspecified, uncomplicated: Secondary | ICD-10-CM | POA: Insufficient documentation

## 2014-02-08 MED ORDER — METHOCARBAMOL 500 MG PO TABS: 500.0000 mg | ORAL_TABLET | Freq: Two times a day (BID) | ORAL | Status: DC

## 2014-02-08 MED ORDER — HYDROCODONE-ACETAMINOPHEN 5-325 MG PO TABS
2.0000 | ORAL_TABLET | Freq: Once | ORAL | Status: AC
  Administered 2014-02-08: 2 via ORAL
  Filled 2014-02-08: qty 2

## 2014-02-08 MED ORDER — NAPROXEN 500 MG PO TABS: 500.0000 mg | ORAL_TABLET | Freq: Two times a day (BID) | ORAL | Status: DC

## 2014-02-08 MED ORDER — HYDROCODONE-ACETAMINOPHEN 5-325 MG PO TABS: 1.0000 | ORAL_TABLET | ORAL | Status: DC | PRN

## 2014-02-08 NOTE — ED Notes (Signed)
Patient still not in room

## 2014-02-08 NOTE — Discharge Instructions (Signed)

## 2014-02-08 NOTE — ED Provider Notes (Signed)
CSN: [id number]     Arrival date & time 02/08/14  1000 History  This chart was scribed for Tanna Furry, MD by Elby Beck, ED Scribe. This patient was seen in room APA07/APA07 and the patient's care was started at 10:22 AM   Chief Complaint  Patient presents with  . Back Pain    The history is provided by the patient. No language interpreter was used.    HPI Comments: Sean Kemp is a 48 y.o. male with a history of chronic back pain and MS who presents to the Emergency Department complaining of constant, moderate, non-radiating lower back pain onset gradually after a fall that occurred 4 days ago. He states that he tripped and fell backwards landing on his buttocks in his home. He states that he has had chronic back pain since 2008 due to a ruptured disc and a compression fracture. He describes that he has some back pain almost every day, but that the pain has been worsened since the fall. He reports occasional numbness in his right leg at baseline with no recent changes. He state that he has tried Aleve for his pain without relief. He states that he does not see a pain management doctor.He denies any associated bowel or bladder incontinence.   Past Medical History  Diagnosis Date  . Multiple sclerosis   . Back pain    Past Surgical History  Procedure Laterality Date  . Wrist surgery    . Brain surgery     No family history on file. History  Substance Use Topics  . Smoking status: Current Every Day Smoker -- 1.00 packs/day    Types: Cigarettes  . Smokeless tobacco: Not on file  . Alcohol Use: No    Review of Systems  Constitutional: Negative for fever, chills, diaphoresis, appetite change and fatigue.  HENT: Negative for mouth sores, sore throat and trouble swallowing.   Eyes: Negative for visual disturbance.  Respiratory: Negative for cough, chest tightness, shortness of breath and wheezing.   Cardiovascular: Negative for chest pain.  Gastrointestinal: Negative for  nausea, vomiting, abdominal pain, diarrhea and abdominal distention.       Denies bowel incontinence  Endocrine: Negative for polydipsia, polyphagia and polyuria.  Genitourinary: Negative for dysuria, frequency and hematuria.       Denies bladder incontinence  Musculoskeletal: Positive for back pain. Negative for gait problem.  Skin: Negative for color change, pallor and rash.  Neurological: Positive for numbness (baseline). Negative for dizziness, syncope, light-headedness and headaches.  Hematological: Does not bruise/bleed easily.  Psychiatric/Behavioral: Negative for behavioral problems and confusion.    Allergies  Ultram  Home Medications   Prior to Admission medications   Medication Sig Start Date End Date Taking? Authorizing Provider  ALPRAZolam Duanne Moron) 0.5 MG tablet Take 0.5 mg by mouth 3 (three) times daily as needed. nerves   Yes Historical Provider, MD  naproxen sodium (ALEVE) 220 MG tablet Take 440 mg by mouth daily as needed (headache).    Yes Historical Provider, MD  HYDROcodone-acetaminophen (NORCO/VICODIN) 5-325 MG per tablet Take 1 tablet by mouth every 4 (four) hours as needed. 02/08/14   Tanna Furry, MD  methocarbamol (ROBAXIN) 500 MG tablet Take 1 tablet (500 mg total) by mouth 2 (two) times daily. 02/08/14   Tanna Furry, MD  naproxen (NAPROSYN) 500 MG tablet Take 1 tablet (500 mg total) by mouth 2 (two) times daily. 02/08/14   Tanna Furry, MD   Triage Vitals: BP 117/70  Pulse 63  Temp(Src) 98  F (36.7 C) (Oral)  Resp 20  Ht 5\' 8"  (1.727 m)  Wt 120 lb (54.432 kg)  BMI 18.25 kg/m2  SpO2 98%  Physical Exam  Constitutional: He is oriented to person, place, and time. He appears well-developed and well-nourished. No distress.  HENT:  Head: Normocephalic.  Eyes: Conjunctivae are normal. Pupils are equal, round, and reactive to light. No scleral icterus.  Neck: Normal range of motion. Neck supple. No thyromegaly present.  Cardiovascular: Normal rate and regular rhythm.   Exam reveals no gallop and no friction rub.   No murmur heard. Pulmonary/Chest: Effort normal and breath sounds normal. No respiratory distress. He has no wheezes. He has no rales.  Abdominal: Soft. Bowel sounds are normal. He exhibits no distension. There is no tenderness. There is no rebound.  Musculoskeletal: Normal range of motion. He exhibits tenderness.  Tenderness around L3-L4 midline  Neurological: He is alert and oriented to person, place, and time.  5/5 strength in lower extremities. Reported numbness in right anterior thigh  Skin: Skin is warm and dry. No rash noted.  Psychiatric: He has a normal mood and affect. His behavior is normal.    ED Course  Procedures (including critical care time)  DIAGNOSTIC STUDIES: Oxygen Saturation is 98% on RA, normal by my interpretation.    COORDINATION OF CARE: 10:23 AM- Will order Norco and an X-ray of pt's L-spine. Pt advised of plan for treatment and pt agrees.  Labs Review Labs Reviewed - No data to display  Imaging Review No results found.   EKG Interpretation None      MDM   Final diagnoses:  Midline low back pain without sciatica   I personally performed the services described in this documentation, which was scribed in my presence. The recorded information has been reviewed and is accurate.   Tanna Furry, MD 02/08/14 1121

## 2014-02-08 NOTE — ED Notes (Signed)
Pt fell Wednesday evening, started to have lower back pain Wednesday night, admits to intermittent numbness to right leg but states "i have had that for a while and it has not changed", denies any problems with urination or bowel control. Pt ambulatory to tx room with NAD noted,

## 2014-02-08 NOTE — ED Notes (Signed)
Went to room to reassess patient's pain and discharge. Patient not in room or in bathroom. Tried to call patient's cell phone that is on record, no answer.

## 2014-05-31 ENCOUNTER — Emergency Department (HOSPITAL_COMMUNITY): Payer: Medicare Other

## 2014-05-31 ENCOUNTER — Emergency Department (HOSPITAL_COMMUNITY)
Admission: EM | Admit: 2014-05-31 | Discharge: 2014-05-31 | Disposition: A | Discharge status: Home / Self Care | Payer: Medicare Other | Attending: Emergency Medicine | Admitting: Emergency Medicine

## 2014-05-31 DIAGNOSIS — Y9389 Activity, other specified: Secondary | ICD-10-CM | POA: Insufficient documentation

## 2014-05-31 DIAGNOSIS — S2020XA Contusion of thorax, unspecified, initial encounter: Secondary | ICD-10-CM | POA: Diagnosis not present

## 2014-05-31 DIAGNOSIS — Z720 Tobacco use: Secondary | ICD-10-CM | POA: Insufficient documentation

## 2014-05-31 DIAGNOSIS — S29001A Unspecified injury of muscle and tendon of front wall of thorax, initial encounter: Secondary | ICD-10-CM | POA: Diagnosis present

## 2014-05-31 DIAGNOSIS — Y92410 Unspecified street and highway as the place of occurrence of the external cause: Secondary | ICD-10-CM | POA: Diagnosis not present

## 2014-05-31 DIAGNOSIS — S20211A Contusion of right front wall of thorax, initial encounter: Secondary | ICD-10-CM

## 2014-05-31 DIAGNOSIS — Z79899 Other long term (current) drug therapy: Secondary | ICD-10-CM | POA: Insufficient documentation

## 2014-05-31 MED ORDER — HYDROCODONE-ACETAMINOPHEN 5-325 MG PO TABS
1.0000 | ORAL_TABLET | Freq: Once | ORAL | Status: AC
  Administered 2014-05-31: 1 via ORAL
  Filled 2014-05-31: qty 1

## 2014-05-31 MED ORDER — IBUPROFEN 800 MG PO TABS: 800.0000 mg | ORAL_TABLET | Freq: Three times a day (TID) | ORAL | Status: DC | PRN

## 2014-05-31 NOTE — ED Provider Notes (Signed)
Medical screening examination/treatment/procedure(s) were conducted as a shared visit with non-physician practitioner(s) and myself.  I personally evaluated the patient during the encounter.   EKG Interpretation None      Pt with chest pain.  pe tender right lower ches  Maudry Diego, MD 05/31/14 [phone]
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
## 2014-05-31 NOTE — Discharge Instructions (Signed)
Follow up with your md if not improving. °

## 2014-06-04 ENCOUNTER — Encounter (HOSPITAL_COMMUNITY): Payer: Self-pay | Admitting: Emergency Medicine

## 2014-06-04 ENCOUNTER — Emergency Department (HOSPITAL_COMMUNITY)
Admission: EM | Admit: 2014-06-04 | Discharge: 2014-06-04 | Disposition: A | Discharge status: Home / Self Care | Payer: Medicare Other | Attending: Emergency Medicine | Admitting: Emergency Medicine

## 2014-06-04 DIAGNOSIS — Z791 Long term (current) use of non-steroidal anti-inflammatories (NSAID): Secondary | ICD-10-CM | POA: Diagnosis not present

## 2014-06-04 DIAGNOSIS — Z79899 Other long term (current) drug therapy: Secondary | ICD-10-CM | POA: Diagnosis not present

## 2014-06-04 DIAGNOSIS — Z720 Tobacco use: Secondary | ICD-10-CM | POA: Insufficient documentation

## 2014-06-04 DIAGNOSIS — Y9389 Activity, other specified: Secondary | ICD-10-CM | POA: Diagnosis not present

## 2014-06-04 DIAGNOSIS — S3992XA Unspecified injury of lower back, initial encounter: Secondary | ICD-10-CM | POA: Diagnosis present

## 2014-06-04 DIAGNOSIS — Y92410 Unspecified street and highway as the place of occurrence of the external cause: Secondary | ICD-10-CM | POA: Diagnosis not present

## 2014-06-04 DIAGNOSIS — Z8669 Personal history of other diseases of the nervous system and sense organs: Secondary | ICD-10-CM | POA: Insufficient documentation

## 2014-06-04 DIAGNOSIS — Z9889 Other specified postprocedural states: Secondary | ICD-10-CM | POA: Diagnosis not present

## 2014-06-04 DIAGNOSIS — S199XXA Unspecified injury of neck, initial encounter: Secondary | ICD-10-CM | POA: Diagnosis not present

## 2014-06-04 DIAGNOSIS — M545 Low back pain: Secondary | ICD-10-CM

## 2014-06-04 MED ORDER — DICLOFENAC SODIUM 75 MG PO TBEC: 75.0000 mg | DELAYED_RELEASE_TABLET | Freq: Two times a day (BID) | ORAL | Status: DC

## 2014-06-04 MED ORDER — BACLOFEN 10 MG PO TABS: 10.0000 mg | ORAL_TABLET | Freq: Three times a day (TID) | ORAL | Status: AC
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
## 2014-06-04 NOTE — ED Provider Notes (Signed)
Medical screening examination/treatment/procedure(s) were performed by non-physician practitioner and as supervising physician I was immediately available for consultation/collaboration.   EKG Interpretation None        Orpah Greek, MD 06/04/14 2328
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
## 2014-11-29 ENCOUNTER — Emergency Department (HOSPITAL_COMMUNITY)
Admission: EM | Admit: 2014-11-29 | Discharge: 2014-11-29 | Disposition: A | Discharge status: Home / Self Care | Payer: Medicare Other | Attending: Emergency Medicine | Admitting: Emergency Medicine

## 2014-11-29 ENCOUNTER — Emergency Department (HOSPITAL_COMMUNITY): Payer: Medicare Other

## 2014-11-29 ENCOUNTER — Encounter (HOSPITAL_COMMUNITY): Payer: Self-pay | Admitting: Emergency Medicine

## 2014-11-29 DIAGNOSIS — G8929 Other chronic pain: Secondary | ICD-10-CM | POA: Diagnosis not present

## 2014-11-29 DIAGNOSIS — N201 Calculus of ureter: Secondary | ICD-10-CM | POA: Insufficient documentation

## 2014-11-29 DIAGNOSIS — Z87442 Personal history of urinary calculi: Secondary | ICD-10-CM | POA: Insufficient documentation

## 2014-11-29 DIAGNOSIS — Z79899 Other long term (current) drug therapy: Secondary | ICD-10-CM | POA: Insufficient documentation

## 2014-11-29 DIAGNOSIS — N23 Unspecified renal colic: Secondary | ICD-10-CM

## 2014-11-29 DIAGNOSIS — Z791 Long term (current) use of non-steroidal anti-inflammatories (NSAID): Secondary | ICD-10-CM | POA: Diagnosis not present

## 2014-11-29 DIAGNOSIS — R109 Unspecified abdominal pain: Secondary | ICD-10-CM

## 2014-11-29 DIAGNOSIS — Z720 Tobacco use: Secondary | ICD-10-CM | POA: Insufficient documentation

## 2014-11-29 HISTORY — DX: Dorsalgia, unspecified: M54.9

## 2014-11-29 HISTORY — DX: Calculus of kidney: N20.0

## 2014-11-29 HISTORY — DX: Sciatica, unspecified side: M54.30

## 2014-11-29 HISTORY — DX: Other chronic pain: G89.29

## 2014-11-29 LAB — CBC WITH DIFFERENTIAL/PLATELET
BASOS ABS: 0 10*3/uL (ref 0.0–0.1)
Basophils Relative: 0 % (ref 0–1)
EOS ABS: 0.2 10*3/uL (ref 0.0–0.7)
EOS PCT: 3 % (ref 0–5)
HEMATOCRIT: 38.3 % — AB (ref 39.0–52.0)
HEMOGLOBIN: 13.1 g/dL (ref 13.0–17.0)
LYMPHS ABS: 1.6 10*3/uL (ref 0.7–4.0)
Lymphocytes Relative: 25 % (ref 12–46)
MCH: 33.2 pg (ref 26.0–34.0)
MCHC: 34.2 g/dL (ref 30.0–36.0)
MCV: 97.2 fL (ref 78.0–100.0)
MONO ABS: 0.4 10*3/uL (ref 0.1–1.0)
Monocytes Relative: 6 % (ref 3–12)
Neutro Abs: 4.3 10*3/uL (ref 1.7–7.7)
Neutrophils Relative %: 66 % (ref 43–77)
Platelets: 251 10*3/uL (ref 150–400)
RBC: 3.94 MIL/uL — ABNORMAL LOW (ref 4.22–5.81)
RDW: 12.6 % (ref 11.5–15.5)
WBC: 6.4 10*3/uL (ref 4.0–10.5)

## 2014-11-29 LAB — COMPREHENSIVE METABOLIC PANEL
ALK PHOS: 67 U/L (ref 39–117)
ALT: 14 U/L (ref 0–53)
AST: 15 U/L (ref 0–37)
Albumin: 4 g/dL (ref 3.5–5.2)
Anion gap: 6 (ref 5–15)
BUN: 14 mg/dL (ref 6–23)
CO2: 29 mmol/L (ref 19–32)
Calcium: 9.1 mg/dL (ref 8.4–10.5)
Chloride: 110 mmol/L (ref 96–112)
Creatinine, Ser: 1.01 mg/dL (ref 0.50–1.35)
GFR, EST NON AFRICAN AMERICAN: 86 mL/min — AB (ref >90)
GLUCOSE: 87 mg/dL (ref 70–99)
Potassium: 3.6 mmol/L (ref 3.5–5.1)
Sodium: 145 mmol/L (ref 135–145)
Total Bilirubin: 0.4 mg/dL (ref 0.3–1.2)
Total Protein: 6.9 g/dL (ref 6.0–8.3)

## 2014-11-29 LAB — URINALYSIS, ROUTINE W REFLEX MICROSCOPIC
BILIRUBIN URINE: NEGATIVE
GLUCOSE, UA: NEGATIVE mg/dL
Hgb urine dipstick: NEGATIVE
KETONES UR: NEGATIVE mg/dL
LEUKOCYTES UA: NEGATIVE
Nitrite: NEGATIVE
PH: 7 (ref 5.0–8.0)
Protein, ur: NEGATIVE mg/dL
SPECIFIC GRAVITY, URINE: 1.015 (ref 1.005–1.030)
Urobilinogen, UA: 0.2 mg/dL (ref 0.0–1.0)

## 2014-11-29 LAB — LIPASE, BLOOD: Lipase: 34 U/L (ref 11–59)

## 2014-11-29 MED ORDER — ONDANSETRON HCL 4 MG/2ML IJ SOLN
4.0000 mg | INTRAMUSCULAR | Status: DC | PRN
  Administered 2014-11-29: 4 mg via INTRAVENOUS
  Filled 2014-11-29: qty 2

## 2014-11-29 MED ORDER — ONDANSETRON HCL 4 MG PO TABS: 4.0000 mg | ORAL_TABLET | Freq: Three times a day (TID) | ORAL | Status: DC | PRN

## 2014-11-29 MED ORDER — NAPROXEN 250 MG PO TABS: 250.0000 mg | ORAL_TABLET | Freq: Two times a day (BID) | ORAL | Status: DC | PRN

## 2014-11-29 MED ORDER — MORPHINE SULFATE 4 MG/ML IJ SOLN
4.0000 mg | INTRAMUSCULAR | Status: DC | PRN
  Administered 2014-11-29: 4 mg via INTRAVENOUS
  Filled 2014-11-29: qty 1

## 2014-11-29 NOTE — ED Provider Notes (Signed)
CSN: [id number]     Arrival date & time 11/29/14  1117 History   First MD Initiated Contact with Patient 11/29/14 1142     Chief Complaint  Patient presents with  . Abdominal Pain  . Flank Pain      HPI Pt was seen at 1145. Per pt, c/o sudden onset and persistence of waxing and waning left sided flank "pain" that began 1 week ago.  Pt describes the pain as "sharp," and radiating into the left side of his abd.  Has been associated with multiple intermittent episodes of N/V.  Denies testicular pain/swelling, no dysuria/hematuria, no abd pain, no diarrhea, no black or blood in emesis, no CP/SOB.     Past Medical History  Diagnosis Date  . Multiple sclerosis   . Chronic back pain   . Sciatica   . Kidney stones    Past Surgical History  Procedure Laterality Date  . Wrist surgery    . Brain surgery      History  Substance Use Topics  . Smoking status: Current Every Day Smoker -- 1.00 packs/day    Types: Cigarettes  . Smokeless tobacco: Not on file  . Alcohol Use: No    Review of Systems ROS: Statement: All systems negative except as marked or noted in the HPI; Constitutional: Negative for fever and chills. ; ; Eyes: Negative for eye pain, redness and discharge. ; ; ENMT: Negative for ear pain, hoarseness, nasal congestion, sinus pressure and sore throat. ; ; Cardiovascular: Negative for chest pain, palpitations, diaphoresis, dyspnea and peripheral edema. ; ; Respiratory: Negative for cough, wheezing and stridor. ; ; Gastrointestinal: +N/V. Negative for diarrhea, abdominal pain, blood in stool, hematemesis, jaundice and rectal bleeding. . ; ; Genitourinary: Negative for dysuria, flank pain and hematuria. ; ; Musculoskeletal: +back pain. Negative for neck pain. Negative for swelling and trauma.; ; Skin: Negative for pruritus, rash, abrasions, blisters, bruising and skin lesion.; ; Neuro: Negative for headache, lightheadedness and neck stiffness. Negative for weakness, altered level of  consciousness , altered mental status, extremity weakness, paresthesias, involuntary movement, seizure and syncope.      Allergies  Ultram  Home Medications   Prior to Admission medications   Medication Sig Start Date End Date Taking? Authorizing Provider  ALPRAZolam Duanne Moron) 0.5 MG tablet Take 0.5 mg by mouth 3 (three) times daily as needed. nerves    Historical Provider, MD  diclofenac (VOLTAREN) 75 MG EC tablet Take 1 tablet (75 mg total) by mouth 2 (two) times daily. 06/04/14   Lily Kocher, PA-C  HYDROcodone-acetaminophen (NORCO/VICODIN) 5-325 MG per tablet Take 1 tablet by mouth every 4 (four) hours as needed. 02/08/14   Tanna Furry, MD  ibuprofen (ADVIL,MOTRIN) 800 MG tablet Take 1 tablet (800 mg total) by mouth every 8 (eight) hours as needed for moderate pain. 05/31/14   Milton Ferguson, MD  methocarbamol (ROBAXIN) 500 MG tablet Take 1 tablet (500 mg total) by mouth 2 (two) times daily. 02/08/14   Tanna Furry, MD  naproxen (NAPROSYN) 500 MG tablet Take 1 tablet (500 mg total) by mouth 2 (two) times daily. 02/08/14   Tanna Furry, MD  naproxen sodium (ALEVE) 220 MG tablet Take 440 mg by mouth daily as needed (headache).     Historical Provider, MD   BP 121/96 mmHg  Pulse 64  Temp(Src) 98.3 F (36.8 C) (Oral)  Resp 16  Ht 5\' 7"  (1.702 m)  Wt 140 lb (63.504 kg)  BMI 21.92 kg/m2  SpO2 100% Physical Exam  1150; Physical examination:  Nursing notes reviewed; Vital signs and O2 SAT reviewed;  Constitutional: Well developed, Well nourished, Well hydrated, In no acute distress; Head:  Normocephalic, atraumatic; Eyes: EOMI, PERRL, No scleral icterus; ENMT: Mouth and pharynx normal, Mucous membranes moist; Neck: Supple, Full range of motion, No lymphadenopathy; Cardiovascular: Regular rate and rhythm, No murmur, rub, or gallop; Respiratory: Breath sounds clear & equal bilaterally, No rales, rhonchi, wheezes.  Speaking full sentences with ease, Normal respiratory effort/excursion; Chest: Nontender,  Movement normal; Abdomen: Soft, Nontender, Nondistended, Normal bowel sounds; Genitourinary: No CVA tenderness; Spine:  No midline CS, TS, LS tenderness. No rash.;; Extremities: Pulses normal, No tenderness, No edema, No calf edema or asymmetry.; Neuro: AA&Ox3, Major CN grossly intact.  Speech clear. No gross focal motor or sensory deficits in extremities. Climbs on and off stretcher easily by himself. Gait steady.; Skin: Color normal, Warm, Dry.   ED Course  Procedures     EKG Interpretation None      MDM  MDM Reviewed: previous chart, nursing note and vitals Reviewed previous: CT scan and labs Interpretation: CT scan, labs and x-ray      Results for orders placed or performed during the hospital encounter of 11/29/14  Comprehensive metabolic panel  Result Value Ref Range   Sodium 145 135 - 145 mmol/L   Potassium 3.6 3.5 - 5.1 mmol/L   Chloride 110 96 - 112 mmol/L   CO2 29 19 - 32 mmol/L   Glucose, Bld 87 70 - 99 mg/dL   BUN 14 6 - 23 mg/dL   Creatinine, Ser 1.01 0.50 - 1.35 mg/dL   Calcium 9.1 8.4 - 10.5 mg/dL   Total Protein 6.9 6.0 - 8.3 g/dL   Albumin 4.0 3.5 - 5.2 g/dL   AST 15 0 - 37 U/L   ALT 14 0 - 53 U/L   Alkaline Phosphatase 67 39 - 117 U/L   Total Bilirubin 0.4 0.3 - 1.2 mg/dL   GFR calc non Af Amer 86 (L) >90 mL/min   GFR calc Af Amer >90 >90 mL/min   Anion gap 6 5 - 15  Lipase, blood  Result Value Ref Range   Lipase 34 11 - 59 U/L  CBC with Differential  Result Value Ref Range   WBC 6.4 4.0 - 10.5 K/uL   RBC 3.94 (L) 4.22 - 5.81 MIL/uL   Hemoglobin 13.1 13.0 - 17.0 g/dL   HCT 38.3 (L) 39.0 - 52.0 %   MCV 97.2 78.0 - 100.0 fL   MCH 33.2 26.0 - 34.0 pg   MCHC 34.2 30.0 - 36.0 g/dL   RDW 12.6 11.5 - 15.5 %   Platelets 251 150 - 400 K/uL   Neutrophils Relative % 66 43 - 77 %   Neutro Abs 4.3 1.7 - 7.7 K/uL   Lymphocytes Relative 25 12 - 46 %   Lymphs Abs 1.6 0.7 - 4.0 K/uL   Monocytes Relative 6 3 - 12 %   Monocytes Absolute 0.4 0.1 - 1.0 K/uL    Eosinophils Relative 3 0 - 5 %   Eosinophils Absolute 0.2 0.0 - 0.7 K/uL   Basophils Relative 0 0 - 1 %   Basophils Absolute 0.0 0.0 - 0.1 K/uL  Urinalysis, Routine w reflex microscopic  Result Value Ref Range   Color, Urine YELLOW YELLOW   APPearance CLOUDY (A) CLEAR   Specific Gravity, Urine 1.015 1.005 - 1.030   pH 7.0 5.0 - 8.0   Glucose, UA NEGATIVE NEGATIVE mg/dL  Hgb urine dipstick NEGATIVE NEGATIVE   Bilirubin Urine NEGATIVE NEGATIVE   Ketones, ur NEGATIVE NEGATIVE mg/dL   Protein, ur NEGATIVE NEGATIVE mg/dL   Urobilinogen, UA 0.2 0.0 - 1.0 mg/dL   Nitrite NEGATIVE NEGATIVE   Leukocytes, UA NEGATIVE NEGATIVE   Dg Chest 2 View 11/29/2014   CLINICAL DATA:  49 year old male with a history of abdominal and flank pain  EXAM: CHEST - 2 VIEW  COMPARISON:  Chest x-ray 05/31/2014  FINDINGS: Cardiomediastinal silhouette projects within normal limits in size and contour.  Stigmata of emphysema, with increased retrosternal airspace, flattened hemidiaphragms, increased AP diameter, and hyperinflation on the AP view.  No confluent airspace disease, pneumothorax, or pleural effusion.  No displaced fracture.  Unremarkable appearance of the upper abdomen.  IMPRESSION: No radiographic evidence of acute cardiopulmonary disease.  Emphysema.  Signed,  Dulcy Fanny. Earleen Newport, DO  [facility]  [facility]   Electronically Signed   By: Corrie Mckusick D.O.   On: 11/29/2014 12:41   Ct Renal Stone Study 11/29/2014   CLINICAL DATA:  Left flank pain x 1 week with nausea and vomiting. Hx of multiple sclerosis, chronic back pain, kidney stone  EXAM: CT ABDOMEN AND PELVIS WITHOUT CONTRAST  TECHNIQUE: Multidetector CT imaging of the abdomen and pelvis was performed following the standard protocol without IV contrast.  COMPARISON:  04/06/2012  FINDINGS: There are bilateral nonobstructing intrarenal stones. There is mild prominence of the left renal pelvis without calyx dilation.  Left ureter is normal in course and in caliber. No ureteral stone is seen. No collecting system is nondistended. Low-density mass arises from the midpole of the left kidney measuring 15 mm consistent with a cyst, present on the prior study. No other renal masses. Bladder is unremarkable.  Prostate gland is enlarged measuring 5.2 cm x 3.9 cm transversely.  Clear lung bases.  Heart normal size.  Liver, spleen, gallbladder, pancreas, adrenal glands:  Normal.  No pathologically enlarged lymph nodes. No abnormal fluid collections.  Mild increased stool noted throughout the colon. No colonic wall thickening or adjacent inflammation. Normal small bowel. Normal appendix.  Prominent Schmorl's node along the upper endplate of L5, stable. No osteoblastic or osteolytic lesions.  IMPRESSION: 1. Prominent left renal pelvis, but no convincing obstructing ureteral stone on the left. This may reflect chronic mild renal pelvis dilation on the left. Recent passage of a left ureteral stone is possible. No convincing acute finding. 2. Bilateral nonobstructing intrarenal stones. 3. Mild increased stool noted throughout the colon. 4. Mild prostatic enlargement.   Electronically Signed   By: Lajean Manes M.D.   On: 11/29/2014 12:42    1410:  No UTI. Labs reassuring. CT with possible recent passage of left ureteral stone. Pt states he feels better after meds and wants to go home now. No N/V while in the ED. Tx symptomatically at this time, f/u Uro MD prn. Dx and testing d/w pt.  Questions answered.  Verb understanding, agreeable to d/c home with outpt f/u.   Francine Graven, DO 11/30/14 2122

## 2014-11-29 NOTE — ED Notes (Signed)
Pt reports left sid epain that radiates across upper abd x 1 week Pt reports nausea/vomiting denies other sx at this time.

## 2014-11-29 NOTE — ED Notes (Signed)
Pt made aware to return if symptoms worsen or if any life threatening symptoms occur.   

## 2014-11-29 NOTE — Discharge Instructions (Signed)
°Emergency Department Resource Guide °1) Find a Doctor and Pay Out of Pocket °Although you won't have to find out who is covered by your insurance plan, it is a good idea to ask around and get recommendations. You will then need to call the office and see if the doctor you have chosen will accept you as a new patient and what types of options they offer for patients who are self-pay. Some doctors offer discounts or will set up payment plans for their patients who do not have insurance, but you will need to ask so you aren't surprised when you get to your appointment. ° °2) Contact Your Local Health Department °Not all health departments have doctors that can see patients for sick visits, but many do, so it is worth a call to see if yours does. If you don't know where your local health department is, you can check in your phone book. The CDC also has a tool to help you locate your state's health department, and many state websites also have listings of all of their local health departments. ° °3) Find a Walk-in Clinic °If your illness is not likely to be very severe or complicated, you may want to try a walk in clinic. These are popping up all over the country in pharmacies, drugstores, and shopping centers. They're usually staffed by nurse practitioners or physician assistants that have been trained to treat common illnesses and complaints. They're usually fairly quick and inexpensive. However, if you have serious medical issues or chronic medical problems, these are probably not your best option. ° °No Primary Care Doctor: °- Call [facility] at  832-8000 - they can help you locate a primary care doctor that  accepts your insurance, provides certain services, etc. °- Physician Referral Service- 1-800-533-3463 ° °Chronic Pain Problems: °Organization         Address  Phone   Notes  °[facility]  (336) 297-2271 Patients need to be referred by their primary care doctor.  ° °Medication  Assistance: °Organization         Address  Phone   Notes  °[place] [facility] [address] °[place], [place] [address] (336) 641-8030 --Must be a resident of [place] °-- Must have NO insurance coverage whatsoever (no Medicaid/ Medicare, etc.) °-- The pt. MUST have a primary care doctor that directs their care regularly and follows them in the community °  °[facility]  (866) 331-1348   °[employer]  (888) 892-1162   ° °Agencies that provide inexpensive medical care: °Organization         Address  Phone   Notes  °[facility]  (336) 832-8035   °[facility]    (336) 832-7272   °[facility] [address] °[place], [place] [address] (336) 832-4777   °[facility] [address], °[place] (336) 271-4999   °[facility]    (336) 373-0678   °[facility]    (336) 272-1050   °[facility] ° [address], [place] Phone:  (336) 832-4444, Fax:  (336) 832-4440 Hours of Operation:  9 am - 6 pm, M-F.  Also accepts Medicaid/Medicare and self-pay.  °[facility] ° [address],  Phone: (336) 832-3150, Fax: (336) [phone]. Hours of Operation:  8:30 am - 5:30 pm, M-F.  Also accepts Medicaid and self-pay.  °[facility] [place] [address], [place] Phone: (336) 878-6027   °[facility] [address], [place], [place] (336)723-1848, Ext. 123 Mondays & Thursdays: 7-9 AM.  First 15 patients are seen on a first come, first serve basis. °  ° °Medicaid-accepting [place] Providers: ° °Organization         Address  Phone   Notes  °[facility] [address], [place] (336) 641-2100 Also accepts self-pay patients.  °[facility] [address], [place] ° (336) 856-9996   °[facility] [address] [place] [address], [place]  (336) 288-8857   °[facility] [address] [place] [address], [place] (336) 299-7000   °Veita Bland [address], [place]  ° (336) 373-1557 Only accepts [place] Access Medicaid patients after they have their name applied to their card.  ° °Self-Pay (no insurance) in [place]: ° °Organization         Address  Phone   Notes  °Sickle Cell Patients, [facility] [address], [place] (336) 832-1970   °[facility] [address], [place] (336) 832-4400   °[facility] ° [address], [place] (336) 992-4800   °[facility]/Dr. Osei-Bonsu ° [address] [place] [address], [place] or [address], [place] (336) [phone] Phone number for both [place] and [place] locations is the same.  °[facility] [address], [place] (336) 299-0000   °[facility] [address] [place] [address], [place] or [address] (336) 852-7530 °(336) 878-2260   °[facility] [address], [place] (336) 350-1642, phone; (336) 294-5005, fax Sees patients 1st and 3rd Saturday of every month.  Must not qualify for public or private insurance (i.e. Medicaid, Medicare, [place] Health Choice, Veterans' Benefits) • Household income should be no more than 200% of the poverty level •The clinic cannot treat you if you are pregnant or think you are pregnant • Sexually transmitted diseases are not treated at the clinic.  ° ° °Dental Care: °Organization         Address  Phone  Notes  °[place] [facility] [address], [place] (336) 641-6152 Accepts children up to age 21 who are enrolled in Medicaid or [place] Health Choice; pregnant women with a Medicaid card; and children who have applied for Medicaid or [place] Health Choice, but were declined, whose parents can pay a reduced fee at time of service.  °[place]  [facility] [place]  [address], [place] (336) 641-7733 Accepts children up to age 21 who are enrolled in Medicaid or [place] Health Choice; pregnant women with a Medicaid card; and children who have applied for Medicaid or [place] Health Choice, but were declined, whose parents can pay a reduced fee at time of service.  °[facility] ° [address],  (336) 641-4533 Patients are seen by appointment only. Walk-ins are not accepted. [facility] will see patients 18 years of age and older. °Monday - Tuesday (8am-5pm) °Most Wednesdays (8:30-5pm) °$30 per visit, cash only  °[facility] ° [address], [place] (336) 641-4533 Patients are seen by appointment only. Walk-ins are not accepted. [facility] will see patients 18 years of age and older. °One   Wednesday Evening (Monthly: Volunteer Based).  $30 per visit, cash only  °[facility]  (919) [phone] for adults; Children under age 4, call Graduate Pediatric Dentistry at ([phone]) [phone]. Children aged 4-14, please call ([phone]) [phone] to request a pediatric application. ° Dental services are provided in all areas of dental care including fillings, crowns and bridges, complete and partial dentures, implants, gum treatment, root canals, and extractions. Preventive care is also provided. Treatment is provided to both adults and children. °Patients are selected via a lottery and there is often a waiting list. °  °[facility] [address], °[place] ° (336) 763-8833 [email or web address] °  °[facility] [address], [place], [place] (336)723-1848, Ext. 123 Second and Fourth Thursday of each month, opens at 6:30 AM; Clinic ends at 9 AM.  Patients are seen on a first-come first-served basis, and a limited number are seen during each clinic.  ° °[facility] ° [address], [place], [place] (336) [phone]    Eligibility Requirements °You must have lived in [place], [place], or [place] counties for at least the last three months. °  You cannot be eligible for state or federal sponsored healthcare insurance, including Veterans Administration, Medicaid, or Medicare. °  You generally cannot be eligible for healthcare insurance through your employer.  °  How to apply: °Eligibility screenings are held every Tuesday and Wednesday afternoon from 1:00 pm until 4:00 pm. You do not need an appointment for the interview!  °[facility] [address], [place], [place] 336-631-2330   °[facility]  336-342-8273   °[facility]  336-703-3100   °[facility]  336-570-6415   ° °Behavioral Health Resources in the Community: °Intensive Outpatient Programs °Organization         Address  Phone  Notes  °[facility] [address], [place], [place] 336-878-6098   °[facility] [address], [place], [place] 336-832-9800   °[facility] [address], [place], [place] ° 336-882-2125   °[facility] [address],  °[place], [place] 1-800-853-5163 or 336-641-4981   °Substance Abuse Resources °Organization         Address  Phone  Notes  °[facility]  336-882-2125   °[facility]  336-784-9470   °[facility]  336-285-9073   °[facility]  336-845-3988   °Residential & Outpatient Substance Abuse Program  1-800-659-3381   °Psychological Services °Organization         Address  Phone  Notes  °[facility]  336- 832-9600   °[facility]  336- 378-7881   °[facility] [address], [place] 1-800-853-5163 or 336-641-4981   ° °Mobile Crisis Teams °Organization         Address  Phone  Notes  °[facility], Mobile Crisis Care Unit  1-877-626-1772   °Assertive °[facility] ° [address]  [place], [place] 336-834-9664   °Sharon DeEsch [address] °[place] 336-554-5454   ° °Self-Help/Support Groups °Organization         Address  Phone             Notes  °[facility] [place] - variety of support groups  336- 373-1402 Call for more information  °Narcotics Anonymous (NA), [facility] [address], °[place]  2 meetings at this location  ° °  Residential Treatment Programs Organization         Address  Phone  Notes  [facility] [address]    [place]  1-587-843-7008   [facility]  [address], [place] [address], [place], [place]   [place], [place] 947-284-3661 Admissions: 8am-3pm M-F  [facility] [address]    [place], [place] [phone]   [facility] [address] [place], [place], [place]   [facility] [address],  [place], [place]   [facility] - Intensive Outpatient [address], [place], [place]   [facility] ([place]  [place], [place] 1-(212)124-8586 or 304-226-3403   [facility] [address] [place], [place] Accepts Medicaid  [facility] [address],  [place] 1-6026620093 Substance Abuse/Addiction Treatment   [facility] Organization         Address  Phone  Notes  [facility]  575-607-3323   Domenic Schwab, PhD [address] [place], [place]   (610)285-3774 or 581-543-6997   [facility] [place], [place] 603-192-1136   [facility] [address] [place], [place] (319) 848-0635 Insurance/Medicaid/sponsorship through [facility] [address], [address]                                    [place], [place] [phone] [facility] [address], [place] [phone]    Dr. Adele Schilder  573-866-2366   [facility] Dept. 1) [address], [place] 2) [place] 3)  [address], [place] ([phone]) [phone]  [phone]   [place] 6504407455 or 9095942052 (After Hours)      Take the prescriptions as directed.  Apply moist heat or ice to the area(s) of discomfort, for 15 minutes at a time, several times per day for the next few days.  Do not fall asleep on a heating or ice pack.  Call the Urologist on Monday to schedule a follow up appointment this week.  Return to the Emergency Department immediately if worsening.

## 2015-04-27 ENCOUNTER — Encounter (HOSPITAL_COMMUNITY): Payer: Self-pay | Admitting: Emergency Medicine

## 2015-04-27 ENCOUNTER — Emergency Department (HOSPITAL_COMMUNITY)
Admission: EM | Admit: 2015-04-27 | Discharge: 2015-04-27 | Disposition: A | Discharge status: Home / Self Care | Payer: Medicare Other | Attending: Emergency Medicine | Admitting: Emergency Medicine

## 2015-04-27 DIAGNOSIS — Z79899 Other long term (current) drug therapy: Secondary | ICD-10-CM | POA: Insufficient documentation

## 2015-04-27 DIAGNOSIS — Y9389 Activity, other specified: Secondary | ICD-10-CM | POA: Insufficient documentation

## 2015-04-27 DIAGNOSIS — G8929 Other chronic pain: Secondary | ICD-10-CM | POA: Diagnosis not present

## 2015-04-27 DIAGNOSIS — Z87442 Personal history of urinary calculi: Secondary | ICD-10-CM | POA: Insufficient documentation

## 2015-04-27 DIAGNOSIS — Z8669 Personal history of other diseases of the nervous system and sense organs: Secondary | ICD-10-CM | POA: Insufficient documentation

## 2015-04-27 DIAGNOSIS — W228XXA Striking against or struck by other objects, initial encounter: Secondary | ICD-10-CM | POA: Diagnosis not present

## 2015-04-27 DIAGNOSIS — Z720 Tobacco use: Secondary | ICD-10-CM | POA: Diagnosis not present

## 2015-04-27 DIAGNOSIS — Y998 Other external cause status: Secondary | ICD-10-CM | POA: Insufficient documentation

## 2015-04-27 DIAGNOSIS — Y92000 Kitchen of unspecified non-institutional (private) residence as  the place of occurrence of the external cause: Secondary | ICD-10-CM | POA: Diagnosis not present

## 2015-04-27 DIAGNOSIS — S0990XA Unspecified injury of head, initial encounter: Secondary | ICD-10-CM | POA: Diagnosis present

## 2015-04-27 DIAGNOSIS — S0003XA Contusion of scalp, initial encounter: Secondary | ICD-10-CM | POA: Insufficient documentation

## 2015-04-27 MED ORDER — HYDROCODONE-ACETAMINOPHEN 5-325 MG PO TABS
2.0000 | ORAL_TABLET | ORAL | Status: DC | PRN
Start: 2015-04-27 — End: 2017-01-14

## 2015-04-27 MED ORDER — HYDROCODONE-ACETAMINOPHEN 5-325 MG PO TABS
1.0000 | ORAL_TABLET | Freq: Once | ORAL | Status: AC
  Administered 2015-04-27: 1 via ORAL
  Filled 2015-04-27: qty 1

## 2015-04-27 NOTE — Discharge Instructions (Signed)
Facial or Scalp Contusion °A facial or scalp contusion is a deep bruise on the face or head. Injuries to the face and head generally cause a lot of swelling, especially around the eyes. Contusions are the result of an injury that caused bleeding under the skin. The contusion may turn blue, purple, or yellow. Minor injuries will give you a painless contusion, but more severe contusions may stay painful and swollen for a few weeks.  °CAUSES  °A facial or scalp contusion is caused by a blunt injury or trauma to the face or head area.  °SIGNS AND SYMPTOMS  °· Swelling of the injured area.   °· Discoloration of the injured area.   °· Tenderness, soreness, or pain in the injured area.   °DIAGNOSIS  °The diagnosis can be made by taking a medical history and doing a physical exam. An X-ray exam, CT scan, or MRI may be needed to determine if there are any associated injuries, such as broken bones (fractures). °TREATMENT  °Often, the best treatment for a facial or scalp contusion is applying cold compresses to the injured area. Over-the-counter medicines may also be recommended for pain control.  °HOME CARE INSTRUCTIONS  °· Only take over-the-counter or prescription medicines as directed by your health care provider.   °· Apply ice to the injured area.   °· Put ice in a plastic bag.   °· Place a towel between your skin and the bag.   °· Leave the ice on for 20 minutes, 2-3 times a day.   °SEEK MEDICAL CARE IF: °· You have bite problems.   °· You have pain with chewing.   °· You are concerned about facial defects. °SEEK IMMEDIATE MEDICAL CARE IF: °· You have severe pain or a headache that is not relieved by medicine.   °· You have unusual sleepiness, confusion, or personality changes.   °· You throw up (vomit).   °· You have a persistent nosebleed.   °· You have double vision or blurred vision.   °· You have fluid drainage from your nose or ear.   °· You have difficulty walking or using your arms or legs.   °MAKE SURE YOU:   °· Understand these instructions. °· Will watch your condition. °· Will get help right away if you are not doing well or get worse. °Document Released: 09/07/2004 Document Revised: 05/21/2013 Document Reviewed: 03/13/2013 °ExitCare® Patient Information ©2015 [employer]. This information is not intended to replace advice given to you by your health care provider. Make sure you discuss any questions you have with your health care provider. ° °Head Injury °You have received a head injury. It does not appear serious at this time. Headaches and vomiting are common following head injury. It should be easy to awaken from sleeping. Sometimes it is necessary for you to stay in the emergency department for a while for observation. Sometimes admission to the hospital may be needed. After injuries such as yours, most problems occur within the first 24 hours, but side effects may occur up to 7-10 days after the injury. It is important for you to carefully monitor your condition and contact your health care provider or seek immediate medical care if there is a change in your condition. °WHAT ARE THE TYPES OF HEAD INJURIES? °Head injuries can be as minor as a bump. Some head injuries can be more severe. More severe head injuries include: °· A jarring injury to the brain (concussion). °· A bruise of the brain (contusion). This mean there is bleeding in the brain that can cause swelling. °· A cracked skull (skull   fracture). °· Bleeding in the brain that collects, clots, and forms a bump (hematoma). °WHAT CAUSES A HEAD INJURY? °A serious head injury is most likely to happen to someone who is in a car wreck and is not wearing a seat belt. Other causes of major head injuries include bicycle or motorcycle accidents, sports injuries, and falls. °HOW ARE HEAD INJURIES DIAGNOSED? °A complete history of the event leading to the injury and your current symptoms will be helpful in diagnosing head injuries. Many times, pictures of the  brain, such as CT or MRI are needed to see the extent of the injury. Often, an overnight hospital stay is necessary for observation.  °WHEN SHOULD I SEEK IMMEDIATE MEDICAL CARE?  °You should get help right away if: °· You have confusion or drowsiness. °· You feel sick to your stomach (nauseous) or have continued, forceful vomiting. °· You have dizziness or unsteadiness that is getting worse. °· You have severe, continued headaches not relieved by medicine. Only take over-the-counter or prescription medicines for pain, fever, or discomfort as directed by your health care provider. °· You do not have normal function of the arms or legs or are unable to walk. °· You notice changes in the black spots in the center of the colored part of your eye (pupil). °· You have a clear or bloody fluid coming from your nose or ears. °· You have a loss of vision. °During the next 24 hours after the injury, you must stay with someone who can watch you for the warning signs. This person should contact local emergency services (911 in the [place] if you have seizures, you become unconscious, or you are unable to wake up. °HOW CAN I PREVENT A HEAD INJURY IN THE FUTURE? °The most important factor for preventing major head injuries is avoiding motor vehicle accidents.  To minimize the potential for damage to your head, it is crucial to wear seat belts while riding in motor vehicles. Wearing helmets while bike riding and playing collision sports (like football) is also helpful. Also, avoiding dangerous activities around the house will further help reduce your risk of head injury.  °WHEN CAN I RETURN TO NORMAL ACTIVITIES AND ATHLETICS? °You should be reevaluated by your health care provider before returning to these activities. If you have any of the following symptoms, you should not return to activities or contact sports until 1 week after the symptoms have stopped: °· Persistent headache. °· Dizziness or vertigo. °· Poor attention and  concentration. °· Confusion. °· Memory problems. °· Nausea or vomiting. °· Fatigue or tire easily. °· Irritability. °· Intolerant of bright lights or loud noises. °· Anxiety or depression. °· Disturbed sleep. °MAKE SURE YOU:  °· Understand these instructions. °· Will watch your condition. °· Will get help right away if you are not doing well or get worse. °Document Released: 07/31/2005 Document Revised: 08/05/2013 Document Reviewed: 04/07/2013 °ExitCare® Patient Information ©2015 [employer]. This information is not intended to replace advice given to you by your health care provider. Make sure you discuss any questions you have with your health care provider. ° °

## 2015-04-27 NOTE — ED Notes (Signed)
Pt raised up and hit his head on open cabinet in kitchen. Knot on head, headache, denies LOC

## 2015-04-27 NOTE — ED Provider Notes (Signed)
CSN: [id number]     Arrival date & time 04/27/15  1932 History   First MD Initiated Contact with Patient 04/27/15 1943     Chief Complaint  Patient presents with  . Head Injury      HPI  She presents for evaluation after hitting his 70s freezer door. He was leaned over into his ureter. With any raised up the freezer door had opened and hit his head on the flat edge, not corner of his freezer. He states it "knocked silly". He did not fall to ground. No loss consciousness. States he has a throbbing headache. Is not on a blood thinner. No numbness weakness tingling. No difficulty with speech or ambulation. 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 Multiple sclerosis   . Chronic back pain   . Sciatica   . Kidney stones    Past Surgical History  Procedure Laterality Date  . Wrist surgery    . Brain surgery     No family history on file. Social History  Substance Use Topics  . Smoking status: Current Every Day Smoker -- 0.50 packs/day    Types: Cigarettes  . Smokeless tobacco: None  . Alcohol Use: No    Review of Systems  Constitutional: Negative for fever, chills, diaphoresis, appetite change and fatigue.  HENT: Negative for mouth sores, sore throat and trouble swallowing.   Eyes: Negative for visual disturbance.  Respiratory: Negative for cough, chest tightness, shortness of breath and wheezing.   Cardiovascular: Negative for chest pain.  Gastrointestinal: Negative for nausea, vomiting, abdominal pain, diarrhea and abdominal distention.  Endocrine: Negative for polydipsia, polyphagia and polyuria.  Genitourinary: Negative for dysuria, frequency and hematuria.  Musculoskeletal: Negative for gait problem.  Skin: Negative for color change, pallor and rash.  Neurological: Positive for headaches. Negative for dizziness, syncope and light-headedness.  Hematological: Does not bruise/bleed easily.    Psychiatric/Behavioral: Negative for behavioral problems and confusion.      Allergies  Ultram and Valium  Home Medications   Prior to Admission medications   Medication Sig Start Date End Date Taking? Authorizing Provider  Aspirin-Acetaminophen-Caffeine (GOODY HEADACHE PO) Take 1 Package by mouth daily as needed (pain).    Historical Provider, MD  Cholecalciferol (VITAMIN D3 ADULT GUMMIES PO) Take 2 tablets by mouth daily.    Historical Provider, MD  diclofenac (VOLTAREN) 75 MG EC tablet Take 1 tablet (75 mg total) by mouth 2 (two) times daily. Patient not taking: Reported on 11/29/2014 06/04/14   Lily Kocher, PA-C  HYDROcodone-acetaminophen (NORCO/VICODIN) 5-325 MG per tablet Take 2 tablets by mouth every 4 (four) hours as needed. 04/27/15   Tanna Furry, MD  ibuprofen (ADVIL,MOTRIN) 800 MG tablet Take 1 tablet (800 mg total) by mouth every 8 (eight) hours as needed for moderate pain. Patient not taking: Reported on 11/29/2014 05/31/14   Milton Ferguson, MD  methocarbamol (ROBAXIN) 500 MG tablet Take 1 tablet (500 mg total) by mouth 2 (two) times daily. Patient not taking: Reported on 11/29/2014 02/08/14   Tanna Furry, MD  naproxen (NAPROSYN) 250 MG tablet Take 1 tablet (250 mg total) by mouth 2 (two) times daily as needed for mild pain or moderate pain (take with food). 11/29/14   Francine Graven, DO  naproxen sodium (ALEVE) 220 MG tablet Take 440 mg by mouth daily as needed (headache).     Historical Provider, MD  ondansetron (ZOFRAN) 4 MG tablet  Take 1 tablet (4 mg total) by mouth every 8 (eight) hours as needed for nausea or vomiting. 11/29/14   Francine Graven, DO   BP 130/87 mmHg  Pulse 58  Temp(Src) 98.1 F (36.7 C) (Oral)  Resp 18  Ht 5\' 7"  (1.702 m)  Wt 120 lb (54.432 kg)  BMI 18.79 kg/m2  SpO2 99% Physical Exam  Constitutional: He is oriented to person, place, and time. He appears well-developed and well-nourished. No distress.  HENT:  Head: Normocephalic.    Eyes:  Conjunctivae are normal. Pupils are equal, round, and reactive to light. No scleral icterus.  Neck: Normal range of motion. Neck supple. No thyromegaly present.  Cardiovascular: Normal rate and regular rhythm.  Exam reveals no gallop and no friction rub.   No murmur heard. Pulmonary/Chest: Effort normal and breath sounds normal. No respiratory distress. He has no wheezes. He has no rales.  Abdominal: Soft. Bowel sounds are normal. He exhibits no distension. There is no tenderness. There is no rebound.  Musculoskeletal: Normal range of motion.  Neurological: He is alert and oriented to person, place, and time.  CN intact.  Normal gait.  Normal strength and sensation to the 4 extremities intact and symmetric.  Skin: Skin is warm and dry. No rash noted.  Psychiatric: He has a normal mood and affect. His behavior is normal.    ED Course  Procedures (including critical care time) Labs Review Labs Reviewed - No data to display  Imaging Review No results found. I have personally reviewed and evaluated these images and lab results as part of my medical decision-making.   EKG Interpretation None      MDM   Final diagnoses:  Scalp contusion, initial encounter   No loss consciousness. Minimal soft tissue swelling. No click or signs of skull fracture. No neurological symptoms or findings. Not on a blood thinner. Appropriate for symptomatic treatment. No indication for CNS imaging.    Tanna Furry, MD 04/27/15 [phone]

## 2015-06-11 ENCOUNTER — Emergency Department (HOSPITAL_COMMUNITY): Payer: Medicare Other

## 2015-06-11 ENCOUNTER — Emergency Department (HOSPITAL_COMMUNITY)
Admission: EM | Admit: 2015-06-11 | Discharge: 2015-06-11 | Disposition: A | Discharge status: Home / Self Care | Payer: Medicare Other | Attending: Emergency Medicine | Admitting: Emergency Medicine

## 2015-06-11 ENCOUNTER — Encounter (HOSPITAL_COMMUNITY): Payer: Self-pay | Admitting: *Deleted

## 2015-06-11 DIAGNOSIS — Y9289 Other specified places as the place of occurrence of the external cause: Secondary | ICD-10-CM | POA: Diagnosis not present

## 2015-06-11 DIAGNOSIS — Z720 Tobacco use: Secondary | ICD-10-CM | POA: Diagnosis not present

## 2015-06-11 DIAGNOSIS — Y998 Other external cause status: Secondary | ICD-10-CM | POA: Insufficient documentation

## 2015-06-11 DIAGNOSIS — S9031XA Contusion of right foot, initial encounter: Secondary | ICD-10-CM | POA: Diagnosis not present

## 2015-06-11 DIAGNOSIS — Z8739 Personal history of other diseases of the musculoskeletal system and connective tissue: Secondary | ICD-10-CM | POA: Insufficient documentation

## 2015-06-11 DIAGNOSIS — Y9389 Activity, other specified: Secondary | ICD-10-CM | POA: Diagnosis not present

## 2015-06-11 DIAGNOSIS — G8929 Other chronic pain: Secondary | ICD-10-CM | POA: Diagnosis not present

## 2015-06-11 DIAGNOSIS — S99921A Unspecified injury of right foot, initial encounter: Secondary | ICD-10-CM | POA: Diagnosis present

## 2015-06-11 DIAGNOSIS — W228XXA Striking against or struck by other objects, initial encounter: Secondary | ICD-10-CM | POA: Diagnosis not present

## 2015-06-11 DIAGNOSIS — Z87442 Personal history of urinary calculi: Secondary | ICD-10-CM | POA: Diagnosis not present

## 2015-06-11 MED ORDER — NAPROXEN 500 MG PO TABS: 500.0000 mg | ORAL_TABLET | Freq: Two times a day (BID) | ORAL | Status: DC

## 2015-06-11 MED ORDER — HYDROCODONE-ACETAMINOPHEN 5-325 MG PO TABS
1.0000 | ORAL_TABLET | Freq: Once | ORAL | Status: AC
  Administered 2015-06-11: 1 via ORAL
  Filled 2015-06-11: qty 1

## 2015-06-11 NOTE — ED Notes (Signed)
Patient d/c papers and prescriptions given and reviewed. Patient verbalized understanding. Patient using crutches out of dept. Without difficulty.

## 2015-06-11 NOTE — Discharge Instructions (Signed)
Contusion Your xray is negative. Follow up with your doctor. Return to the ED if you develop new or worsening symptoms. A contusion is a deep bruise. Contusions are the result of a blunt injury to tissues and muscle fibers under the skin. The injury causes bleeding under the skin. The skin overlying the contusion may turn blue, purple, or yellow. Minor injuries will give you a painless contusion, but more severe contusions may stay painful and swollen for a few weeks.  CAUSES  This condition is usually caused by a blow, trauma, or direct force to an area of the body. SYMPTOMS  Symptoms of this condition include:  Swelling of the injured area.  Pain and tenderness in the injured area.  Discoloration. The area may have redness and then turn blue, purple, or yellow. DIAGNOSIS  This condition is diagnosed based on a physical exam and medical history. An X-ray, CT scan, or MRI may be needed to determine if there are any associated injuries, such as broken bones (fractures). TREATMENT  Specific treatment for this condition depends on what area of the body was injured. In general, the best treatment for a contusion is resting, icing, applying pressure to (compression), and elevating the injured area. This is often called the RICE strategy. Over-the-counter anti-inflammatory medicines may also be recommended for pain control.  HOME CARE INSTRUCTIONS   Rest the injured area.  If directed, apply ice to the injured area:  Put ice in a plastic bag.  Place a towel between your skin and the bag.  Leave the ice on for 20 minutes, 2-3 times per day.  If directed, apply light compression to the injured area using an elastic bandage. Make sure the bandage is not wrapped too tightly. Remove and reapply the bandage as directed by your health care provider.  If possible, raise (elevate) the injured area above the level of your heart while you are sitting or lying down.  Take over-the-counter and  prescription medicines only as told by your health care provider. SEEK MEDICAL CARE IF:  Your symptoms do not improve after several days of treatment.  Your symptoms get worse.  You have difficulty moving the injured area. SEEK IMMEDIATE MEDICAL CARE IF:   You have severe pain.  You have numbness in a hand or foot.  Your hand or foot turns pale or cold.   This information is not intended to replace advice given to you by your health care provider. Make sure you discuss any questions you have with your health care provider.   Document Released: 05/10/2005 Document Revised: 04/21/2015 Document Reviewed: 12/16/2014 Elsevier Interactive Patient Education [employer].
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
## 2015-10-26 ENCOUNTER — Ambulatory Visit: Payer: Medicare Other | Admitting: Neurology

## 2015-11-16 ENCOUNTER — Ambulatory Visit: Payer: Medicare Other | Admitting: Neurology

## 2015-11-19 ENCOUNTER — Encounter: Payer: Self-pay | Admitting: Neurology

## 2015-12-21 ENCOUNTER — Ambulatory Visit: Payer: Medicare Other | Admitting: Neurology

## 2015-12-22 ENCOUNTER — Encounter: Payer: Self-pay | Admitting: Neurology

## 2016-01-27 ENCOUNTER — Telehealth: Payer: Self-pay | Admitting: Neurology

## 2016-01-27 NOTE — Telephone Encounter (Addendum)
Patient called to request appointment after July 5th. Patient advised, he missed last couple of appointments (12/21/15 No Show, 11/16/15 No Show, 10/26/15 left without being seen), advised it has now been over 3 years since he was last seen in our office, advised will need a new referral from PCP, then NP coordinator will call to schedule. (Patient actually hasn't been seen in our office yet. Patient has missed 2 New Patient appointments and left without being seen on 1 visit)

## 2016-01-27 NOTE — Telephone Encounter (Signed)
Noted/fim 

## 2016-02-21 ENCOUNTER — Emergency Department (HOSPITAL_COMMUNITY)
Admission: EM | Admit: 2016-02-21 | Discharge: 2016-02-21 | Disposition: A | Discharge status: Home / Self Care | Payer: Medicare Other | Attending: Emergency Medicine | Admitting: Emergency Medicine

## 2016-02-21 ENCOUNTER — Encounter (HOSPITAL_COMMUNITY): Payer: Self-pay | Admitting: Emergency Medicine

## 2016-02-21 ENCOUNTER — Emergency Department (HOSPITAL_COMMUNITY): Payer: Medicare Other

## 2016-02-21 DIAGNOSIS — Y9389 Activity, other specified: Secondary | ICD-10-CM | POA: Insufficient documentation

## 2016-02-21 DIAGNOSIS — S39012A Strain of muscle, fascia and tendon of lower back, initial encounter: Secondary | ICD-10-CM | POA: Diagnosis not present

## 2016-02-21 DIAGNOSIS — Y92410 Unspecified street and highway as the place of occurrence of the external cause: Secondary | ICD-10-CM | POA: Insufficient documentation

## 2016-02-21 DIAGNOSIS — F17210 Nicotine dependence, cigarettes, uncomplicated: Secondary | ICD-10-CM | POA: Diagnosis not present

## 2016-02-21 DIAGNOSIS — R0789 Other chest pain: Secondary | ICD-10-CM | POA: Diagnosis not present

## 2016-02-21 DIAGNOSIS — S161XXA Strain of muscle, fascia and tendon at neck level, initial encounter: Secondary | ICD-10-CM | POA: Insufficient documentation

## 2016-02-21 DIAGNOSIS — M542 Cervicalgia: Secondary | ICD-10-CM | POA: Diagnosis present

## 2016-02-21 DIAGNOSIS — Z7982 Long term (current) use of aspirin: Secondary | ICD-10-CM | POA: Diagnosis not present

## 2016-02-21 DIAGNOSIS — Y999 Unspecified external cause status: Secondary | ICD-10-CM | POA: Diagnosis not present

## 2016-02-21 MED ORDER — HYDROCODONE-ACETAMINOPHEN 5-325 MG PO TABS
2.0000 | ORAL_TABLET | Freq: Once | ORAL | Status: AC
Start: 2016-02-21 — End: 2016-02-21
  Administered 2016-02-21: 2 via ORAL
  Filled 2016-02-21: qty 2
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
## 2016-02-21 NOTE — Discharge Instructions (Signed)
Back Pain, Adult °Back pain is very common in adults. The cause of back pain is rarely dangerous and the pain often gets better over time. The cause of your back pain may not be known. Some common causes of back pain include: °· Strain of the muscles or ligaments supporting the spine. °· Wear and tear (degeneration) of the spinal disks. °· Arthritis. °· Direct injury to the back. °For many people, back pain may return. Since back pain is rarely dangerous, most people can learn to manage this condition on their own. °HOME CARE INSTRUCTIONS °Watch your back pain for any changes. The following actions may help to lessen any discomfort you are feeling: °· Remain active. It is stressful on your back to sit or stand in one place for long periods of time. Do not sit, drive, or stand in one place for more than 30 minutes at a time. Take short walks on even surfaces as soon as you are able. Try to increase the length of time you walk each day. °· Exercise regularly as directed by your health care provider. Exercise helps your back heal faster. It also helps avoid future injury by keeping your muscles strong and flexible. °· Do not stay in bed. Resting more than 1-2 days can delay your recovery. °· Pay attention to your body when you bend and lift. The most comfortable positions are those that put less stress on your recovering back. Always use proper lifting techniques, including: °¨ Bending your knees. °¨ Keeping the load close to your body. °¨ Avoiding twisting. °· Find a comfortable position to sleep. Use a firm mattress and lie on your side with your knees slightly bent. If you lie on your back, put a pillow under your knees. °· Avoid feeling anxious or stressed. Stress increases muscle tension and can worsen back pain. It is important to recognize when you are anxious or stressed and learn ways to manage it, such as with exercise. °· Take medicines only as directed by your health care provider. Over-the-counter  medicines to reduce pain and inflammation are often the most helpful. Your health care provider may prescribe muscle relaxant drugs. These medicines help dull your pain so you can more quickly return to your normal activities and healthy exercise. °· Apply ice to the injured area: °¨ Put ice in a plastic bag. °¨ Place a towel between your skin and the bag. °¨ Leave the ice on for 20 minutes, 2-3 times a day for the first 2-3 days. After that, ice and heat may be alternated to reduce pain and spasms. °· Maintain a healthy weight. Excess weight puts extra stress on your back and makes it difficult to maintain good posture. °SEEK MEDICAL CARE IF: °· You have pain that is not relieved with rest or medicine. °· You have increasing pain going down into the legs or buttocks. °· You have pain that does not improve in one week. °· You have night pain. °· You lose weight. °· You have a fever or chills. °SEEK IMMEDIATE MEDICAL CARE IF:  °· You develop new bowel or bladder control problems. °· You have unusual weakness or numbness in your arms or legs. °· You develop nausea or vomiting. °· You develop abdominal pain. °· You feel faint. °  °This information is not intended to replace advice given to you by your health care provider. Make sure you discuss any questions you have with your health care provider. °  °Document Released: 07/31/2005 Document Revised: 08/21/2014 Document Reviewed: 12/02/2013 °Elsevier Interactive Patient Education ©2016 Elsevier   Inc. Cervical Sprain A cervical sprain is an injury in the neck in which the strong, fibrous tissues (ligaments) that connect your neck bones stretch or tear. Cervical sprains can range from mild to severe. Severe cervical sprains can cause the neck vertebrae to be unstable. This can lead to damage of the spinal cord and can result in serious nervous system problems. The amount of time it takes for a cervical sprain to get better depends on the cause and extent of the injury.  Most cervical sprains heal in 1 to 3 weeks. CAUSES  Severe cervical sprains may be caused by:   Contact sport injuries (such as from football, rugby, wrestling, hockey, auto racing, gymnastics, diving, martial arts, or boxing).   Motor vehicle collisions.   Whiplash injuries. This is an injury from a sudden forward and backward whipping movement of the head and neck.  Falls.  Mild cervical sprains may be caused by:   Being in an awkward position, such as while cradling a telephone between your ear and shoulder.   Sitting in a chair that does not offer proper support.   Working at a poorly Landscape [employer] station.   Looking up or down for long periods of time.  SYMPTOMS   Pain, soreness, stiffness, or a burning sensation in the front, back, or sides of the neck. This discomfort may develop immediately after the injury or slowly, 24 hours or more after the injury.   Pain or tenderness directly in the middle of the back of the neck.   Shoulder or upper back pain.   Limited ability to move the neck.   Headache.   Dizziness.   Weakness, numbness, or tingling in the hands or arms.   Muscle spasms.   Difficulty swallowing or chewing.   Tenderness and swelling of the neck.  DIAGNOSIS  Most of the time your health care provider can diagnose a cervical sprain by taking your history and doing a physical exam. Your health care provider will ask about previous neck injuries and any known neck problems, such as arthritis in the neck. X-rays may be taken to find out if there are any other problems, such as with the bones of the neck. Other tests, such as a CT scan or MRI, may also be needed.  TREATMENT  Treatment depends on the severity of the cervical sprain. Mild sprains can be treated with rest, keeping the neck in place (immobilization), and pain medicines. Severe cervical sprains are immediately immobilized. Further treatment is done to help with pain, muscle  spasms, and other symptoms and may include:  Medicines, such as pain relievers, numbing medicines, or muscle relaxants.   Physical therapy. This may involve stretching exercises, strengthening exercises, and posture training. Exercises and improved posture can help stabilize the neck, strengthen muscles, and help stop symptoms from returning.  HOME CARE INSTRUCTIONS   Put ice on the injured area.   Put ice in a plastic bag.   Place a towel between your skin and the bag.   Leave the ice on for 15-20 minutes, 3-4 times a day.   If your injury was severe, you may have been given a cervical collar to wear. A cervical collar is a two-piece collar designed to keep your neck from moving while it heals.  Do not remove the collar unless instructed by your health care provider.  If you have long hair, keep it outside of the collar.  Ask your health care provider before making any adjustments to your  collar. Minor adjustments may be required over time to improve comfort and reduce pressure on your chin or on the back of your head.  Ifyou are allowed to remove the collar for cleaning or bathing, follow your health care provider's instructions on how to do so safely.  Keep your collar clean by wiping it with mild soap and water and drying it completely. If the collar you have been given includes removable pads, remove them every 1-2 days and hand wash them with soap and water. Allow them to air dry. They should be completely dry before you wear them in the collar.  If you are allowed to remove the collar for cleaning and bathing, wash and dry the skin of your neck. Check your skin for irritation or sores. If you see any, tell your health care provider.  Do not drive while wearing the collar.   Only take over-the-counter or prescription medicines for pain, discomfort, or fever as directed by your health care provider.   Keep all follow-up appointments as directed by your health care  provider.   Keep all physical therapy appointments as directed by your health care provider.   Make any needed adjustments to your workstation to promote good posture.   Avoid positions and activities that make your symptoms worse.   Warm up and stretch before being active to help prevent problems.  SEEK MEDICAL CARE IF:   Your pain is not controlled with medicine.   You are unable to decrease your pain medicine over time as planned.   Your activity level is not improving as expected.  SEEK IMMEDIATE MEDICAL CARE IF:   You develop any bleeding.  You develop stomach upset.  You have signs of an allergic reaction to your medicine.   Your symptoms get worse.   You develop new, unexplained symptoms.   You have numbness, tingling, weakness, or paralysis in any part of your body.  MAKE SURE YOU:   Understand these instructions.  Will watch your condition.  Will get help right away if you are not doing well or get worse.   This information is not intended to replace advice given to you by your health care provider. Make sure you discuss any questions you have with your health care provider.   Document Released: 05/28/2007 Document Revised: 08/05/2013 Document Reviewed: 02/05/2013 Elsevier Interactive Patient Education [employer].
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
## 2016-02-23 ENCOUNTER — Ambulatory Visit: Payer: Self-pay | Admitting: Neurology

## 2016-09-18 ENCOUNTER — Ambulatory Visit (INDEPENDENT_AMBULATORY_CARE_PROVIDER_SITE_OTHER): Payer: Medicare Other | Admitting: Otolaryngology

## 2016-09-18 DIAGNOSIS — H6123 Impacted cerumen, bilateral: Secondary | ICD-10-CM | POA: Diagnosis not present

## 2016-09-18 DIAGNOSIS — H900 Conductive hearing loss, bilateral: Secondary | ICD-10-CM

## 2016-09-20 ENCOUNTER — Ambulatory Visit: Payer: Medicare Other | Admitting: Neurology

## 2016-09-20 ENCOUNTER — Encounter: Payer: Self-pay | Admitting: Neurology

## 2016-09-20 ENCOUNTER — Telehealth: Payer: Self-pay | Admitting: *Deleted

## 2016-09-20 NOTE — Telephone Encounter (Signed)
Patient has noshowed 4 new pt. appointments.  He was incorrectly rescheduled this morning for 10-16-16.  I have spoken with Dr. Ernestene Mention to noshows, he will not be able to see this patient.  I have tried calling pt. to explain and cancel his 10-16-16 appt.  Mobile phone does not accept incoming calls, and home phone rings with no answer, no answering machine/fim

## 2016-10-16 ENCOUNTER — Ambulatory Visit: Payer: Medicare Other | Admitting: Neurology

## 2017-01-14 ENCOUNTER — Emergency Department (HOSPITAL_COMMUNITY): Payer: Medicare Other

## 2017-01-14 ENCOUNTER — Encounter (HOSPITAL_COMMUNITY): Payer: Self-pay | Admitting: Emergency Medicine

## 2017-01-14 ENCOUNTER — Emergency Department (HOSPITAL_COMMUNITY)
Admission: EM | Admit: 2017-01-14 | Discharge: 2017-01-14 | Disposition: A | Discharge status: Home / Self Care | Payer: Medicare Other | Attending: Emergency Medicine | Admitting: Emergency Medicine

## 2017-01-14 DIAGNOSIS — Z79899 Other long term (current) drug therapy: Secondary | ICD-10-CM | POA: Insufficient documentation

## 2017-01-14 DIAGNOSIS — F17210 Nicotine dependence, cigarettes, uncomplicated: Secondary | ICD-10-CM | POA: Insufficient documentation

## 2017-01-14 DIAGNOSIS — R202 Paresthesia of skin: Secondary | ICD-10-CM | POA: Diagnosis present

## 2017-01-14 HISTORY — DX: Cutaneous abscess, unspecified: L02.91

## 2017-01-14 LAB — BASIC METABOLIC PANEL
ANION GAP: 9 (ref 5–15)
BUN: 12 mg/dL (ref 6–20)
CHLORIDE: 100 mmol/L — AB (ref 101–111)
CO2: 29 mmol/L (ref 22–32)
Calcium: 9.9 mg/dL (ref 8.9–10.3)
Creatinine, Ser: 0.99 mg/dL (ref 0.61–1.24)
Glucose, Bld: 91 mg/dL (ref 65–99)
POTASSIUM: 4.5 mmol/L (ref 3.5–5.1)
SODIUM: 138 mmol/L (ref 135–145)

## 2017-01-14 LAB — CBC WITH DIFFERENTIAL/PLATELET
BASOS ABS: 0 10*3/uL (ref 0.0–0.1)
Basophils Relative: 0 %
Eosinophils Absolute: 0.1 10*3/uL (ref 0.0–0.7)
Eosinophils Relative: 2 %
HEMATOCRIT: 44 % (ref 39.0–52.0)
Hemoglobin: 15.5 g/dL (ref 13.0–17.0)
LYMPHS ABS: 1.7 10*3/uL (ref 0.7–4.0)
LYMPHS PCT: 32 %
MCH: 33.6 pg (ref 26.0–34.0)
MCHC: 35.2 g/dL (ref 30.0–36.0)
MCV: 95.4 fL (ref 78.0–100.0)
Monocytes Absolute: 0.3 10*3/uL (ref 0.1–1.0)
Monocytes Relative: 6 %
NEUTROS ABS: 3.1 10*3/uL (ref 1.7–7.7)
Neutrophils Relative %: 60 %
Platelets: 292 10*3/uL (ref 150–400)
RBC: 4.61 MIL/uL (ref 4.22–5.81)
RDW: 12.7 % (ref 11.5–15.5)
WBC: 5.3 10*3/uL (ref 4.0–10.5)

## 2017-01-14 MED ORDER — IBUPROFEN 800 MG PO TABS
800.0000 mg | ORAL_TABLET | Freq: Once | ORAL | Status: AC
  Administered 2017-01-14: 800 mg via ORAL
  Filled 2017-01-14: qty 1

## 2017-01-14 NOTE — ED Provider Notes (Signed)
[name] DEPT Provider Note   CSN: [id number] Arrival date & time: 01/14/17  0903     History   Chief Complaint Chief Complaint  Patient presents with  . Weakness    HPI Sean Kemp is a 51 y.o. male.  Patient complains of sense of tingling in his left shoulder and the left abdomen and thorax. He likens the symptoms to similar symptoms when he had a brain abscess in 2012 which is treated by Dr. Saintclair Halsted with surgery. Additionally he feels tired c poor sleep and appetite.   He additionally claims a diagnosis of multiple sclerosis, but has no neurologist at this time. No gross neurological deficits, fever, sweats, chills      Past Medical History:  Diagnosis Date  . Abscess    on brain  . Chronic back pain   . Kidney stones   . Multiple sclerosis ([place])   . Sciatica     Patient Active Problem List   Diagnosis Date Noted  . COLLES' FRACTURE, RIGHT 06/06/2007    Past Surgical History:  Procedure Laterality Date  . BRAIN SURGERY    . WRIST SURGERY         Home Medications    Prior to Admission medications   Medication Sig Start Date End Date Taking? Authorizing Provider  ALPRAZolam Duanne Moron) 0.5 MG tablet Take 1 tablet by mouth 3 (three) times daily. 01/26/16  Yes [provider]  Aspirin-Acetaminophen-Caffeine (GOODY HEADACHE PO) Take 1 Package by mouth daily as needed (pain).   Yes [provider]  ibuprofen (ADVIL,MOTRIN) 200 MG tablet Take 200-400 mg by mouth every 8 (eight) hours as needed for headache, mild pain or moderate pain.   Yes [provider]  naproxen sodium (ALEVE) 220 MG tablet Take 440 mg by mouth daily as needed (headache).    Yes [provider]    Family History History reviewed. No pertinent family history.  Social History Social History  Substance Use Topics  . Smoking status: Current Every Day Smoker    Packs/day: 0.50    Types: Cigarettes  . Smokeless tobacco: Never Used  . Alcohol use  No     Allergies   Ultram [tramadol hcl] and Valium [diazepam]   Review of Systems Review of Systems  All other systems reviewed and are negative.    Physical Exam Updated Vital Signs BP 120/89   Pulse (!) 58   Temp 97.9 F (36.6 C) (Oral)   Resp (!) 28   Ht 5\' 6"  (1.676 m)   Wt 54.4 kg (120 lb)   SpO2 99%   BMI 19.37 kg/m   Physical Exam  Constitutional: He is oriented to person, place, and time. He appears well-developed and well-nourished.  HENT:  Head: Normocephalic and atraumatic.  Eyes: Conjunctivae are normal.  Neck: Neck supple.  Cardiovascular: Normal rate and regular rhythm.   Pulmonary/Chest: Effort normal and breath sounds normal.  Abdominal: Soft. Bowel sounds are normal.  Musculoskeletal: Normal range of motion.  Neurological: He is alert and oriented to person, place, and time.  Skin: Skin is warm and dry.  Psychiatric: He has a normal mood and affect. His behavior is normal.  Nursing note and vitals reviewed.    ED Treatments / Results  Labs (all labs ordered are listed, but only abnormal results are displayed) Labs Reviewed  BASIC METABOLIC PANEL - Abnormal; Notable for the following:       Result Value   Chloride 100 (*)    All other  components within normal limits  CBC WITH DIFFERENTIAL/PLATELET    EKG  EKG Interpretation None      Date: 01/14/2017  Rate: 70  Rhythm: normal sinus rhythm  QRS Axis: normal  Intervals: normal  ST/T Wave abnormalities: normal  Conduction Disutrbances: none  Narrative Interpretation: unremarkable     Radiology Ct Head Wo Contrast  Result Date: 01/14/2017 CLINICAL DATA:  51 year old male with headache and left arm tingling for 2 days. History of MS and prior right surgery for brain abscess. EXAM: CT HEAD WITHOUT CONTRAST TECHNIQUE: Contiguous axial images were obtained from the base of the skull through the vertex without intravenous contrast. COMPARISON:  04/18/2012 CT and prior exams FINDINGS:  Brain: No evidence of acute infarction, hemorrhage, hydrocephalus, extra-axial collection or mass lesion/mass effect. Right frontal encephalomalacia and chronic left posterior parietal white matter hypodensity again noted. Vascular: No hyperdense vessel or unexpected calcification. Skull: Right frontal craniotomy changes noted. No acute abnormality. Sinuses/Orbits: No acute finding. Other: None. IMPRESSION: No evidence of acute intracranial abnormality. Right frontal encephalomalacia and chronic left posterior parietal white matter changes. Electronically Signed   By: Margarette Canada M.D.   On: 01/14/2017 10:23    Procedures Procedures (including critical care time)  Medications Ordered in ED Medications  ibuprofen (ADVIL,MOTRIN) tablet 800 mg (800 mg Oral Given 01/14/17 1034)     Initial Impression / Assessment and Plan / ED Course  I have reviewed the triage vital signs and the nursing notes.  Pertinent labs & imaging results that were available during my care of the patient were reviewed by me and considered in my medical decision making (see chart for details).     Patient has normal physical exam. CT head shows no acute findings. He was ambulatory in the room without neurological deficits. He has primary care follow-up.  Final Clinical Impressions(s) / ED Diagnoses   Final diagnoses:  Tingling    New Prescriptions Discharge Medication List as of 01/14/2017 12:04 PM       Nat Christen, MD 01/14/17 1304

## 2017-01-14 NOTE — Discharge Instructions (Signed)
Tests showed no life-threatening condition.  Follow-up your primary care doctor. °

## 2017-01-14 NOTE — ED Triage Notes (Signed)
Pt c/o gen weakness, left arm tingling and ha x 2 days. States like this 2 yrs go and dx with MS edp in now

## 2017-01-14 NOTE — ED Notes (Signed)
Pt asking for something for his head.  Dr. Lacinda Axon notified.

## 2017-01-14 NOTE — ED Notes (Signed)
Pt states he is ready to get up and get out of here.  Dr, Lacinda Axon made aware.

## 2017-03-19 ENCOUNTER — Ambulatory Visit (INDEPENDENT_AMBULATORY_CARE_PROVIDER_SITE_OTHER): Payer: Medicare Other | Admitting: Otolaryngology

## 2017-09-20 DIAGNOSIS — M545 Low back pain: Secondary | ICD-10-CM | POA: Diagnosis not present

## 2017-09-20 DIAGNOSIS — G35 Multiple sclerosis: Secondary | ICD-10-CM | POA: Diagnosis not present

## 2017-09-20 DIAGNOSIS — R202 Paresthesia of skin: Secondary | ICD-10-CM | POA: Diagnosis not present

## 2017-09-26 DIAGNOSIS — G629 Polyneuropathy, unspecified: Secondary | ICD-10-CM | POA: Diagnosis not present

## 2017-09-26 DIAGNOSIS — G35 Multiple sclerosis: Secondary | ICD-10-CM | POA: Diagnosis not present

## 2017-09-26 DIAGNOSIS — I10 Essential (primary) hypertension: Secondary | ICD-10-CM | POA: Diagnosis not present

## 2017-11-28 DIAGNOSIS — G629 Polyneuropathy, unspecified: Secondary | ICD-10-CM | POA: Diagnosis not present

## 2017-11-28 DIAGNOSIS — R202 Paresthesia of skin: Secondary | ICD-10-CM | POA: Diagnosis not present

## 2017-11-28 DIAGNOSIS — G35 Multiple sclerosis: Secondary | ICD-10-CM | POA: Diagnosis not present

## 2017-11-28 DIAGNOSIS — Z0000 Encounter for general adult medical examination without abnormal findings: Secondary | ICD-10-CM | POA: Diagnosis not present

## 2017-12-05 ENCOUNTER — Encounter (INDEPENDENT_AMBULATORY_CARE_PROVIDER_SITE_OTHER): Payer: Self-pay | Admitting: *Deleted

## 2017-12-18 DIAGNOSIS — R202 Paresthesia of skin: Secondary | ICD-10-CM | POA: Diagnosis not present

## 2017-12-18 DIAGNOSIS — G43C1 Periodic headache syndromes in child or adult, intractable: Secondary | ICD-10-CM | POA: Diagnosis not present

## 2017-12-18 DIAGNOSIS — G35 Multiple sclerosis: Secondary | ICD-10-CM | POA: Diagnosis not present

## 2017-12-19 ENCOUNTER — Encounter (HOSPITAL_COMMUNITY)
Admission: RE | Admit: 2017-12-19 | Discharge: 2017-12-19 | Disposition: A | Discharge status: Home / Self Care | Payer: Medicare Other | Source: Ambulatory Visit | Attending: Neurology | Admitting: Neurology

## 2017-12-19 ENCOUNTER — Other Ambulatory Visit: Payer: Self-pay | Admitting: Neurology

## 2017-12-19 DIAGNOSIS — R202 Paresthesia of skin: Secondary | ICD-10-CM

## 2017-12-19 DIAGNOSIS — R269 Unspecified abnormalities of gait and mobility: Secondary | ICD-10-CM

## 2017-12-19 DIAGNOSIS — G35 Multiple sclerosis: Secondary | ICD-10-CM | POA: Diagnosis not present

## 2017-12-19 MED ORDER — SODIUM CHLORIDE 0.9 % IV SOLN
INTRAVENOUS | Status: DC
  Administered 2017-12-19: 250 mL via INTRAVENOUS

## 2017-12-19 MED ORDER — SODIUM CHLORIDE 0.9 % IV SOLN
1000.0000 mg | Freq: Once | INTRAVENOUS | Status: AC
  Administered 2017-12-19: 1000 mg via INTRAVENOUS
  Filled 2017-12-19: qty 8

## 2017-12-20 ENCOUNTER — Encounter (HOSPITAL_COMMUNITY)
Admission: RE | Admit: 2017-12-20 | Discharge: 2017-12-20 | Disposition: A | Discharge status: Home / Self Care | Payer: Medicare Other | Source: Ambulatory Visit | Attending: Neurology | Admitting: Neurology

## 2017-12-20 ENCOUNTER — Encounter (HOSPITAL_COMMUNITY): Payer: Self-pay

## 2017-12-20 DIAGNOSIS — G35 Multiple sclerosis: Secondary | ICD-10-CM | POA: Diagnosis not present

## 2017-12-20 MED ORDER — SODIUM CHLORIDE 0.9 % IV SOLN
INTRAVENOUS | Status: DC
  Administered 2017-12-20: 11:00:00 via INTRAVENOUS

## 2017-12-20 MED ORDER — SODIUM CHLORIDE 0.9 % IV SOLN
1000.0000 mg | Freq: Every day | INTRAVENOUS | Status: DC
  Administered 2017-12-20: 1000 mg via INTRAVENOUS
  Filled 2017-12-20: qty 8

## 2017-12-20 NOTE — Progress Notes (Signed)
Tolerated Solumedrol infusion without s/s of reaction.  IV Hep locked and remains in place for last infusion tomorrow.

## 2017-12-21 ENCOUNTER — Encounter (HOSPITAL_COMMUNITY): Payer: Self-pay

## 2017-12-21 ENCOUNTER — Encounter (HOSPITAL_COMMUNITY)
Admission: RE | Admit: 2017-12-21 | Discharge: 2017-12-21 | Disposition: A | Discharge status: Home / Self Care | Payer: Medicare Other | Source: Ambulatory Visit | Attending: Neurology | Admitting: Neurology

## 2017-12-21 DIAGNOSIS — G35 Multiple sclerosis: Secondary | ICD-10-CM | POA: Diagnosis not present

## 2017-12-21 MED ORDER — SODIUM CHLORIDE 0.9 % IV SOLN
Freq: Once | INTRAVENOUS | Status: AC
  Administered 2017-12-21: 10:00:00 via INTRAVENOUS

## 2017-12-21 MED ORDER — SODIUM CHLORIDE 0.9 % IV SOLN
1000.0000 mg | Freq: Every day | INTRAVENOUS | Status: DC
  Administered 2017-12-21: 1000 mg via INTRAVENOUS
  Filled 2017-12-21: qty 8

## 2017-12-24 DIAGNOSIS — E559 Vitamin D deficiency, unspecified: Secondary | ICD-10-CM | POA: Diagnosis not present

## 2017-12-24 DIAGNOSIS — Z79899 Other long term (current) drug therapy: Secondary | ICD-10-CM | POA: Diagnosis not present

## 2017-12-27 ENCOUNTER — Other Ambulatory Visit: Payer: Self-pay | Admitting: Neurology

## 2017-12-27 DIAGNOSIS — R202 Paresthesia of skin: Secondary | ICD-10-CM

## 2017-12-27 DIAGNOSIS — R269 Unspecified abnormalities of gait and mobility: Secondary | ICD-10-CM

## 2017-12-27 DIAGNOSIS — G35 Multiple sclerosis: Secondary | ICD-10-CM

## 2017-12-28 ENCOUNTER — Ambulatory Visit (HOSPITAL_COMMUNITY)
Admission: RE | Admit: 2017-12-28 | Discharge: 2017-12-28 | Disposition: A | Discharge status: Home / Self Care | Payer: Medicare Other | Source: Ambulatory Visit | Attending: Neurology | Admitting: Neurology

## 2017-12-28 DIAGNOSIS — G35 Multiple sclerosis: Secondary | ICD-10-CM | POA: Insufficient documentation

## 2017-12-28 DIAGNOSIS — R269 Unspecified abnormalities of gait and mobility: Secondary | ICD-10-CM | POA: Insufficient documentation

## 2017-12-28 DIAGNOSIS — R202 Paresthesia of skin: Secondary | ICD-10-CM

## 2017-12-28 DIAGNOSIS — M47812 Spondylosis without myelopathy or radiculopathy, cervical region: Secondary | ICD-10-CM | POA: Diagnosis not present

## 2017-12-28 MED ORDER — GADOBENATE DIMEGLUMINE 529 MG/ML IV SOLN
10.0000 mL | Freq: Once | INTRAVENOUS | Status: AC | PRN
  Administered 2017-12-28: 10 mL via INTRAVENOUS

## 2018-01-08 ENCOUNTER — Encounter (INDEPENDENT_AMBULATORY_CARE_PROVIDER_SITE_OTHER): Payer: Self-pay | Admitting: *Deleted

## 2018-01-14 ENCOUNTER — Other Ambulatory Visit (INDEPENDENT_AMBULATORY_CARE_PROVIDER_SITE_OTHER): Payer: Self-pay | Admitting: *Deleted

## 2018-01-14 DIAGNOSIS — Z1211 Encounter for screening for malignant neoplasm of colon: Secondary | ICD-10-CM

## 2018-01-29 DIAGNOSIS — G35 Multiple sclerosis: Secondary | ICD-10-CM | POA: Diagnosis not present

## 2018-01-29 DIAGNOSIS — Z79899 Other long term (current) drug therapy: Secondary | ICD-10-CM | POA: Diagnosis not present

## 2018-01-29 DIAGNOSIS — G43C1 Periodic headache syndromes in child or adult, intractable: Secondary | ICD-10-CM | POA: Diagnosis not present

## 2018-01-29 DIAGNOSIS — R202 Paresthesia of skin: Secondary | ICD-10-CM | POA: Diagnosis not present

## 2018-02-12 ENCOUNTER — Encounter (INDEPENDENT_AMBULATORY_CARE_PROVIDER_SITE_OTHER): Payer: Self-pay | Admitting: *Deleted

## 2018-02-12 ENCOUNTER — Telehealth (INDEPENDENT_AMBULATORY_CARE_PROVIDER_SITE_OTHER): Payer: Self-pay | Admitting: *Deleted

## 2018-02-12 MED ORDER — PEG 3350-KCL-NA BICARB-NACL 420 G PO SOLR: 4000.0000 mL | Freq: Once | ORAL | 0 refills | Status: AC

## 2018-02-12 NOTE — Telephone Encounter (Signed)
Patient needs trilyte 

## 2018-02-13 ENCOUNTER — Other Ambulatory Visit: Payer: Self-pay

## 2018-02-13 ENCOUNTER — Encounter (HOSPITAL_COMMUNITY): Payer: Self-pay | Admitting: Emergency Medicine

## 2018-02-13 ENCOUNTER — Emergency Department (HOSPITAL_COMMUNITY)
Admission: EM | Admit: 2018-02-13 | Discharge: 2018-02-13 | Disposition: A | Discharge status: Home / Self Care | Payer: Medicare Other | Attending: Emergency Medicine | Admitting: Emergency Medicine

## 2018-02-13 DIAGNOSIS — F322 Major depressive disorder, single episode, severe without psychotic features: Secondary | ICD-10-CM | POA: Insufficient documentation

## 2018-02-13 DIAGNOSIS — F1910 Other psychoactive substance abuse, uncomplicated: Secondary | ICD-10-CM | POA: Insufficient documentation

## 2018-02-13 DIAGNOSIS — F17210 Nicotine dependence, cigarettes, uncomplicated: Secondary | ICD-10-CM | POA: Insufficient documentation

## 2018-02-13 DIAGNOSIS — G35 Multiple sclerosis: Secondary | ICD-10-CM | POA: Insufficient documentation

## 2018-02-13 DIAGNOSIS — R45851 Suicidal ideations: Secondary | ICD-10-CM | POA: Insufficient documentation

## 2018-02-13 DIAGNOSIS — Z79899 Other long term (current) drug therapy: Secondary | ICD-10-CM | POA: Diagnosis not present

## 2018-02-13 HISTORY — DX: Other psychoactive substance abuse, uncomplicated: F19.10

## 2018-02-13 LAB — CBC
HCT: 40.5 % (ref 39.0–52.0)
Hemoglobin: 13.6 g/dL (ref 13.0–17.0)
MCH: 34 pg (ref 26.0–34.0)
MCHC: 33.6 g/dL (ref 30.0–36.0)
MCV: 101.3 fL — ABNORMAL HIGH (ref 78.0–100.0)
PLATELETS: 238 10*3/uL (ref 150–400)
RBC: 4 MIL/uL — AB (ref 4.22–5.81)
RDW: 12.9 % (ref 11.5–15.5)
WBC: 7.2 10*3/uL (ref 4.0–10.5)

## 2018-02-13 LAB — COMPREHENSIVE METABOLIC PANEL
ALK PHOS: 98 U/L (ref 38–126)
ALT: 14 U/L (ref 0–44)
ANION GAP: 6 (ref 5–15)
AST: 16 U/L (ref 15–41)
Albumin: 4.4 g/dL (ref 3.5–5.0)
BUN: 7 mg/dL (ref 6–20)
CALCIUM: 9.4 mg/dL (ref 8.9–10.3)
CO2: 30 mmol/L (ref 22–32)
Chloride: 104 mmol/L (ref 98–111)
Creatinine, Ser: 0.88 mg/dL (ref 0.61–1.24)
Glucose, Bld: 93 mg/dL (ref 70–99)
Potassium: 4.3 mmol/L (ref 3.5–5.1)
Sodium: 140 mmol/L (ref 135–145)
TOTAL PROTEIN: 7.4 g/dL (ref 6.5–8.1)
Total Bilirubin: 0.6 mg/dL (ref 0.3–1.2)

## 2018-02-13 LAB — RAPID URINE DRUG SCREEN, HOSP PERFORMED
AMPHETAMINES: NOT DETECTED
BENZODIAZEPINES: POSITIVE — AB
Cocaine: POSITIVE — AB
Opiates: NOT DETECTED
Tetrahydrocannabinol: NOT DETECTED

## 2018-02-13 LAB — ETHANOL

## 2018-02-13 MED ORDER — ZOLPIDEM TARTRATE 5 MG PO TABS: 5.0000 mg | ORAL_TABLET | Freq: Every evening | ORAL | Status: DC | PRN

## 2018-02-13 MED ORDER — NICOTINE 21 MG/24HR TD PT24: 21.0000 mg | MEDICATED_PATCH | Freq: Every day | TRANSDERMAL | Status: DC | PRN

## 2018-02-13 NOTE — ED Triage Notes (Signed)
Pt reports he is seeking medical help for detox from cocaine, pain pills, gabapentin, and adderall. Denies SI/HI, but states he told his mother two weeks ago if he had a gun he would end it. In triage pt states "I'm going to kill myself anyway" referencing drug use.

## 2018-02-13 NOTE — ED Provider Notes (Signed)
[facility] EMERGENCY DEPARTMENT Provider Note   CSN: [id number] Arrival date & time: 02/13/18  1712     History   Chief Complaint Chief Complaint  Patient presents with  . Medical Clearance    HPI Sean Kemp is a 52 y.o. male.  HPI  Pt was seen at 1800. Per pt, c/o gradual onset and worsening of persistent depression and SI for the past 2 weeks. Pt states he has "has been using drugs for the past 20 years." Drugs include: cocaine, "pain pills," gabapentin, adderall. Pt endorses SI, saying "if I had a gun I would end it." Denies SA, no HI, no hallucinations.   Past Medical History:  Diagnosis Date  . Abscess    on brain  . Chronic back pain   . Drug abuse ([place])   . Kidney stones   . Multiple sclerosis ([place])   . Sciatica     Patient Active Problem List   Diagnosis Date Noted  . Special screening for malignant neoplasms, colon 01/14/2018  . COLLES' FRACTURE, RIGHT 06/06/2007    Past Surgical History:  Procedure Laterality Date  . BRAIN SURGERY    . WRIST SURGERY          Home Medications    Prior to Admission medications   Medication Sig Start Date End Date Taking? Authorizing Provider  ALPRAZolam Duanne Moron) 0.5 MG tablet Take 1 tablet by mouth 3 (three) times daily. 01/26/16   [provider]  Aspirin-Acetaminophen-Caffeine (GOODY HEADACHE PO) Take 1 Package by mouth daily as needed (pain).    [provider]  gabapentin (NEURONTIN) 300 MG capsule Take 300 mg by mouth 2 (two) times daily.    [provider]  ibuprofen (ADVIL,MOTRIN) 200 MG tablet Take 200-400 mg by mouth every 8 (eight) hours as needed for headache, mild pain or moderate pain.    [provider]  naproxen sodium (ALEVE) 220 MG tablet Take 440 mg by mouth daily as needed (headache).     [provider]    Family History History reviewed. No pertinent family history.  Social History Social History   Tobacco Use  . Smoking status: Current  Every Day Smoker    Packs/day: 0.50    Types: Cigarettes  . Smokeless tobacco: Never Used  Substance Use Topics  . Alcohol use: No  . Drug use: Yes    Types: Cocaine    Comment: adderall, pain pills, and gabapentin      Allergies   Ultram [tramadol hcl] and Valium [diazepam]   Review of Systems Review of Systems ROS: Statement: All systems negative except as marked or noted in the HPI; Constitutional: Negative for fever and chills. ; ; Eyes: Negative for eye pain, redness and discharge. ; ; ENMT: Negative for ear pain, hoarseness, nasal congestion, sinus pressure and sore throat. ; ; Cardiovascular: Negative for chest pain, palpitations, diaphoresis, dyspnea and peripheral edema. ; ; Respiratory: Negative for cough, wheezing and stridor. ; ; Gastrointestinal: Negative for nausea, vomiting, diarrhea, abdominal pain, blood in stool, hematemesis, jaundice and rectal bleeding. . ; ; Genitourinary: Negative for dysuria, flank pain and hematuria. ; ; Musculoskeletal: Negative for back pain and neck pain. Negative for swelling and trauma.; ; Skin: Negative for pruritus, rash, abrasions, blisters, bruising and skin lesion.; ; Neuro: Negative for headache, lightheadedness and neck stiffness. Negative for weakness, altered level of consciousness, altered mental status, extremity weakness, paresthesias, involuntary movement, seizure and syncope.; Psych:  +SI, no SA, no HI, no hallucinations.  Physical Exam Updated Vital Signs BP (!) 157/109   Pulse 70   Temp 98.2 F (36.8 C)   Resp 18   Ht 5\' 7"  (1.702 m)   Wt 53.1 kg (117 lb)   SpO2 100%   BMI 18.32 kg/m   Physical Exam 1805: Physical examination:  Nursing notes reviewed; Vital signs and O2 SAT reviewed;  Constitutional: Well developed, Well nourished, Well hydrated, In no acute distress; Head:  Normocephalic, atraumatic; Eyes: EOMI, PERRL, No scleral icterus; ENMT: Mouth and pharynx normal, Mucous membranes moist; Neck: Supple,  Full range of motion; Cardiovascular: Regular rate and rhythm; Respiratory: Breath sounds clear, No wheezes.  Speaking full sentences with ease, Normal respiratory effort/excursion; Chest: No deformity, Movement normal; Abdomen: Nondistended; Extremities: No deformity.; Neuro: AA&Ox3, Major CN grossly intact.  Speech clear. No gross focal motor deficits in extremities. Climbs on and off stretcher easily by himself. Gait steady.; Skin: Color normal, Warm, Dry.; Psych:  Affect flat.    ED Treatments / Results  Labs (all labs ordered are listed, but only abnormal results are displayed)   EKG None  Radiology   Procedures Procedures (including critical care time)  Medications Ordered in ED Medications - No data to display   Initial Impression / Assessment and Plan / ED Course  I have reviewed the triage vital signs and the nursing notes.  Pertinent labs & imaging results that were available during my care of the patient were reviewed by me and considered in my medical decision making (see chart for details).  MDM Reviewed: previous chart, nursing note and vitals Reviewed previous: labs Interpretation: labs   Results for orders placed or performed during the hospital encounter of 02/13/18  Comprehensive metabolic panel  Result Value Ref Range   Sodium 140 135 - 145 mmol/L   Potassium 4.3 3.5 - 5.1 mmol/L   Chloride 104 98 - 111 mmol/L   CO2 30 22 - 32 mmol/L   Glucose, Bld 93 70 - 99 mg/dL   BUN 7 6 - 20 mg/dL   Creatinine, Ser 0.88 0.61 - 1.24 mg/dL   Calcium 9.4 8.9 - 10.3 mg/dL   Total Protein 7.4 6.5 - 8.1 g/dL   Albumin 4.4 3.5 - 5.0 g/dL   AST 16 15 - 41 U/L   ALT 14 0 - 44 U/L   Alkaline Phosphatase 98 38 - 126 U/L   Total Bilirubin 0.6 0.3 - 1.2 mg/dL   GFR calc non Af Amer >60 >60 mL/min   GFR calc Af Amer >60 >60 mL/min   Anion gap 6 5 - 15  Ethanol  Result Value Ref Range   Alcohol, Ethyl (B) <10 <10 mg/dL  cbc  Result Value Ref Range   WBC 7.2 4.0 - 10.5  K/uL   RBC 4.00 (L) 4.22 - 5.81 MIL/uL   Hemoglobin 13.6 13.0 - 17.0 g/dL   HCT 40.5 39.0 - 52.0 %   MCV 101.3 (H) 78.0 - 100.0 fL   MCH 34.0 26.0 - 34.0 pg   MCHC 33.6 30.0 - 36.0 g/dL   RDW 12.9 11.5 - 15.5 %   Platelets 238 150 - 400 K/uL  Rapid urine drug screen (hospital performed)  Result Value Ref Range   Opiates NONE DETECTED NONE DETECTED   Cocaine POSITIVE (A) NONE DETECTED   Benzodiazepines POSITIVE (A) NONE DETECTED   Amphetamines NONE DETECTED NONE DETECTED   Tetrahydrocannabinol NONE DETECTED NONE DETECTED   Barbiturates (A) NONE DETECTED    Result not available.  Reagent lot number recalled by manufacturer.    1845:  Will have TTS evaluate.   2030:  TTS has evaluated pt: recommends inpt treatment, placement pending. Holding orders written.   Final Clinical Impressions(s) / ED Diagnoses   Final diagnoses:  None    ED Discharge Orders    None       Francine Graven, DO 02/13/18 2032

## 2018-02-13 NOTE — Progress Notes (Signed)
Pt accepted to [facility] 305-2. Attending provider will be Dr. Mallie Darting, MD. Call to report [phone].   Pt's nurse Alyse Low, RN confirms the pt can perform his ADLs and states she will recheck his vitals. Bed available pending review of updated vitals.   Lind Covert, MSW, LCSW Therapeutic Triage Specialist  [phone]

## 2018-02-13 NOTE — ED Notes (Addendum)
Per Lind Covert, [facility],  Pt is recommended for inpatient placement, and will notify us when placement is obtained.

## 2018-02-13 NOTE — BH Assessment (Addendum)
Tele Assessment Note   Patient Name: Sean Kemp MRN: [id number] Referring Physician: Francine Graven, DO Location of Patient: [facility] Location of Provider: [place] is an 52 y.o. male who presents to the ED VOL accompanied by his sister. Pt states he has been abusing cocaine and pain pills for years and is feeling suicidal. Pt denies that he has a specific plan to commit suicide but states he "will probably end up overdosing on crack cocaine." Pt states he would rather die than to continue living this way. Pt denies that he has attempted suicide in the past. Pt denies HI. Pt states he has experienced AVH only when he is withdrawing for substances. Pt states he has also abused pain pills including Adderall and Oxycodone. Pt states he has been abusing drugs for the past 20 years and he has not been able to stop using. Pt states he feels helpless and believes that if he does not receive help, he is going to die. Pt presents with multiple symptoms of depression including insomnia, feelings of worthlessness, loss of appetite, feelings of hopelessness, loss of interest in usual pleasures, and guilt.   Pt states he has not been sleeping or eating. Pt states he went from weighing 165 lbs to 117 lbs due to loss of appetite. Pt states he replaced eating with drugs.   TTS consulted with Lindon Romp, NP who recommends inpt treatment.   Diagnosis: MDD, single episode, severe, w/o psychosis; Substance induced mood disorder; Cocaine use disorder, severe   Past Medical History:  Past Medical History:  Diagnosis Date  . Abscess    on brain  . Chronic back pain   . Drug abuse ([place])   . Kidney stones   . Multiple sclerosis ([place])   . Sciatica     Past Surgical History:  Procedure Laterality Date  . BRAIN SURGERY    . WRIST SURGERY      Family History: History reviewed. No pertinent family history.  Social History:  reports that he has been  smoking cigarettes.  He has been smoking about 0.50 packs per day. He has never used smokeless tobacco. He reports that he has current or past drug history. Drug: Cocaine. He reports that he does not drink alcohol.  Additional Social History:  Alcohol / Drug Use Pain Medications: See MAR Prescriptions: See MAR Over the Counter: See MAR History of alcohol / drug use?: Yes Longest period of sobriety (when/how long): 1-2 months at a time Substance #1 Name of Substance 1: Cocaine 1 - Age of First Use: 30s 1 - Amount (size/oz): varies 1 - Frequency: daily 1 - Duration: ongoing 1 - Last Use / Amount: 02/12/18 Substance #2 Name of Substance 2: "Pain Pills" 2 - Age of First Use: 30s 2 - Amount (size/oz): varies 2 - Frequency: pt states "whenever I can get them" 2 - Duration: ongoing 2 - Last Use / Amount: about 2 weeks ago  CIWA: CIWA-Ar BP: (!) 157/109 Pulse Rate: 70 COWS:    Allergies:  Allergies  Allergen Reactions  . Ultram [Tramadol Hcl] Hives  . Valium [Diazepam] Other (See Comments)    Blisters in mouth    Home Medications:  (Not in a hospital admission)  OB/GYN Status:  No LMP for male patient.  General Assessment Data Location of Assessment: [facility] TTS Assessment: In system Is this a Tele or Face-to-Face Assessment?: Tele Assessment Is this an Initial Assessment or a Re-assessment for this encounter?:  Initial Assessment Marital status: Single Is patient pregnant?: No Pregnancy Status: No Living Arrangements: Parent Can pt return to current living arrangement?: Yes Admission Status: Voluntary Is patient capable of signing voluntary admission?: Yes Referral Source: Self/Family/Friend Insurance type: [facility]     Crisis Care Plan Living Arrangements: Parent Name of Psychiatrist: none Name of Therapist: none  Education Status Is patient currently in school?: No Is the patient employed, unemployed or receiving disability?: Receiving disability income  Risk  to self with the past 6 months Suicidal Ideation: Yes-Currently Present Has patient been a risk to self within the past 6 months prior to admission? : Yes(increased substance abuse) Suicidal Intent: No Has patient had any suicidal intent within the past 6 months prior to admission? : No Is patient at risk for suicide?: Yes Suicidal Plan?: No Has patient had any suicidal plan within the past 6 months prior to admission? : No Access to Means: No What has been your use of drugs/alcohol within the last 12 months?: daily cocaine use Previous Attempts/Gestures: No Triggers for Past Attempts: None known Intentional Self Injurious Behavior: None Family Suicide History: No Recent stressful life event(s): Other (Comment)(substance abuse) Persecutory voices/beliefs?: No Depression: Yes Depression Symptoms: Insomnia, Loss of interest in usual pleasures, Guilt, Feeling worthless/self pity Substance abuse history and/or treatment for substance abuse?: Yes Suicide prevention information given to non-admitted patients: Not applicable  Risk to Others within the past 6 months Homicidal Ideation: No Does patient have any lifetime risk of violence toward others beyond the six months prior to admission? : No Thoughts of Harm to Others: No Current Homicidal Intent: No Current Homicidal Plan: No Access to Homicidal Means: No History of harm to others?: No Assessment of Violence: None Noted Does patient have access to weapons?: No Criminal Charges Pending?: No Does patient have a court date: No Is patient on probation?: No  Psychosis Hallucinations: Auditory, Visual(during withdrawals) Delusions: None noted  Mental Status Report Appearance/Hygiene: In scrubs, Unremarkable Eye Contact: Good Motor Activity: Freedom of movement Speech: Logical/coherent Level of Consciousness: Alert Mood: Depressed, Despair, Helpless, Worthless, low self-esteem Affect: Depressed Anxiety Level: None Thought  Processes: Relevant, Coherent Judgement: Partial Orientation: Place, Person, Time, Situation, Appropriate for developmental age Obsessive Compulsive Thoughts/Behaviors: None  Cognitive Functioning Concentration: Normal Memory: Remote Intact, Recent Intact Is patient IDD: No Is patient DD?: No Insight: Poor Impulse Control: Poor Appetite: Poor Have you had any weight changes? : Loss Amount of the weight change? (lbs): 40 lbs Sleep: Decreased Total Hours of Sleep: 4 Vegetative Symptoms: None  ADLScreening [facility] Assessment Services) Patient's cognitive ability adequate to safely complete daily activities?: Yes Patient able to express need for assistance with ADLs?: Yes Independently performs ADLs?: Yes (appropriate for developmental age)  Prior Inpatient Therapy Prior Inpatient Therapy: Yes Prior Therapy Dates: 2018  Prior Therapy Facilty/Provider(s): [facility], facilities in [place] Reason for Treatment: substance abuse   Prior Outpatient Therapy Prior Outpatient Therapy: No Does patient have an ACCT team?: No Does patient have Intensive In-House Services?  : No Does patient have [facility] services? : No Does patient have P4CC services?: No  ADL Screening (condition at time of admission) Patient's cognitive ability adequate to safely complete daily activities?: Yes Is the patient deaf or have difficulty hearing?: No Does the patient have difficulty seeing, even when wearing glasses/contacts?: No Does the patient have difficulty concentrating, remembering, or making decisions?: No Patient able to express need for assistance with ADLs?: Yes Does the patient have difficulty dressing or bathing?: No Independently  performs ADLs?: Yes (appropriate for developmental age) Does the patient have difficulty walking or climbing stairs?: No Weakness of Legs: None Weakness of Arms/Hands: None  Home Assistive Devices/Equipment Home Assistive Devices/Equipment: Eyeglasses     Abuse/Neglect Assessment (Assessment to be complete while patient is alone) Abuse/Neglect Assessment Can Be Completed: Yes Physical Abuse: Denies Verbal Abuse: Denies Sexual Abuse: Denies Exploitation of patient/patient's resources: Denies Self-Neglect: Denies     Regulatory affairs officer (For Healthcare) Does Patient Have a Medical Advance Directive?: No Would patient like information on creating a medical advance directive?: No - Patient declined    Additional Information 1:1 In Past 12 Months?: No CIRT Risk: No Elopement Risk: No Does patient have medical clearance?: Yes     Disposition: TTS consulted with Lindon Romp, NP who recommends inpt treatment.   Disposition Initial Assessment Completed for this Encounter: Yes Disposition of Patient: Admit Type of inpatient treatment program: Adult(per Lindon Romp, NP) Patient refused recommended treatment: No  This service was provided via telemedicine using a 2-way, interactive audio and video technology.  Names of all persons participating in this telemedicine service and their role in this encounter. Name: Sean Kemp Role: Patient  Name: Kalman Shan Role: Sister  Name: Lind Covert Role: TTS       Lyanne Co 02/13/2018 8:18 PM

## 2018-02-13 NOTE — Progress Notes (Signed)
TTS consulted with Lindon Romp, NP who recommends inpt treatment. EDP Francine Graven, DO and charge nurse Hassan Rowan, RN have been advised of the disposition.  Lind Covert, MSW, LCSW Therapeutic Triage Specialist  [phone]

## 2018-02-14 ENCOUNTER — Encounter (HOSPITAL_COMMUNITY): Payer: Self-pay | Admitting: *Deleted

## 2018-02-14 ENCOUNTER — Other Ambulatory Visit: Payer: Self-pay

## 2018-02-14 ENCOUNTER — Inpatient Hospital Stay (HOSPITAL_COMMUNITY)
Admission: AD | Admit: 2018-02-14 | Discharge: 2018-02-18 | DRG: 885 | Disposition: A | Discharge status: Home / Self Care | Payer: Medicare Other | Source: Intra-hospital | Attending: Psychiatry | Admitting: Psychiatry

## 2018-02-14 DIAGNOSIS — F1124 Opioid dependence with opioid-induced mood disorder: Secondary | ICD-10-CM | POA: Diagnosis not present

## 2018-02-14 DIAGNOSIS — F1110 Opioid abuse, uncomplicated: Secondary | ICD-10-CM | POA: Diagnosis present

## 2018-02-14 DIAGNOSIS — F1520 Other stimulant dependence, uncomplicated: Secondary | ICD-10-CM | POA: Diagnosis not present

## 2018-02-14 DIAGNOSIS — Z79891 Long term (current) use of opiate analgesic: Secondary | ICD-10-CM | POA: Diagnosis not present

## 2018-02-14 DIAGNOSIS — F332 Major depressive disorder, recurrent severe without psychotic features: Secondary | ICD-10-CM | POA: Diagnosis present

## 2018-02-14 DIAGNOSIS — Z598 Other problems related to housing and economic circumstances: Secondary | ICD-10-CM

## 2018-02-14 DIAGNOSIS — G4700 Insomnia, unspecified: Secondary | ICD-10-CM | POA: Diagnosis present

## 2018-02-14 DIAGNOSIS — Z87442 Personal history of urinary calculi: Secondary | ICD-10-CM | POA: Diagnosis not present

## 2018-02-14 DIAGNOSIS — F1994 Other psychoactive substance use, unspecified with psychoactive substance-induced mood disorder: Secondary | ICD-10-CM

## 2018-02-14 DIAGNOSIS — G8929 Other chronic pain: Secondary | ICD-10-CM | POA: Diagnosis present

## 2018-02-14 DIAGNOSIS — Z888 Allergy status to other drugs, medicaments and biological substances status: Secondary | ICD-10-CM

## 2018-02-14 DIAGNOSIS — F1490 Cocaine use, unspecified, uncomplicated: Secondary | ICD-10-CM | POA: Diagnosis present

## 2018-02-14 DIAGNOSIS — Z818 Family history of other mental and behavioral disorders: Secondary | ICD-10-CM | POA: Diagnosis not present

## 2018-02-14 DIAGNOSIS — F1120 Opioid dependence, uncomplicated: Secondary | ICD-10-CM | POA: Diagnosis present

## 2018-02-14 DIAGNOSIS — D539 Nutritional anemia, unspecified: Secondary | ICD-10-CM | POA: Diagnosis present

## 2018-02-14 DIAGNOSIS — Z885 Allergy status to narcotic agent status: Secondary | ICD-10-CM | POA: Diagnosis not present

## 2018-02-14 DIAGNOSIS — M549 Dorsalgia, unspecified: Secondary | ICD-10-CM | POA: Diagnosis present

## 2018-02-14 DIAGNOSIS — F17210 Nicotine dependence, cigarettes, uncomplicated: Secondary | ICD-10-CM | POA: Diagnosis present

## 2018-02-14 DIAGNOSIS — Z79899 Other long term (current) drug therapy: Secondary | ICD-10-CM

## 2018-02-14 DIAGNOSIS — F1320 Sedative, hypnotic or anxiolytic dependence, uncomplicated: Secondary | ICD-10-CM | POA: Diagnosis not present

## 2018-02-14 DIAGNOSIS — Z560 Unemployment, unspecified: Secondary | ICD-10-CM | POA: Diagnosis not present

## 2018-02-14 DIAGNOSIS — F14280 Cocaine dependence with cocaine-induced anxiety disorder: Secondary | ICD-10-CM | POA: Diagnosis not present

## 2018-02-14 DIAGNOSIS — G35 Multiple sclerosis: Secondary | ICD-10-CM | POA: Diagnosis present

## 2018-02-14 DIAGNOSIS — Z23 Encounter for immunization: Secondary | ICD-10-CM

## 2018-02-14 DIAGNOSIS — Z635 Disruption of family by separation and divorce: Secondary | ICD-10-CM | POA: Diagnosis not present

## 2018-02-14 DIAGNOSIS — F1424 Cocaine dependence with cocaine-induced mood disorder: Secondary | ICD-10-CM | POA: Diagnosis present

## 2018-02-14 DIAGNOSIS — F419 Anxiety disorder, unspecified: Secondary | ICD-10-CM | POA: Diagnosis present

## 2018-02-14 DIAGNOSIS — R45851 Suicidal ideations: Secondary | ICD-10-CM | POA: Diagnosis present

## 2018-02-14 DIAGNOSIS — R450 Nervousness: Secondary | ICD-10-CM | POA: Diagnosis not present

## 2018-02-14 LAB — TSH: TSH: 1.689 u[IU]/mL (ref 0.350–4.500)

## 2018-02-14 LAB — LIPID PANEL
Cholesterol: 161 mg/dL (ref 0–200)
HDL: 53 mg/dL (ref >40)
LDL CALC: 68 mg/dL (ref 0–99)
Total CHOL/HDL Ratio: 3 RATIO
Triglycerides: 201 mg/dL — ABNORMAL HIGH (ref <150)
VLDL: 40 mg/dL (ref 0–40)

## 2018-02-14 LAB — HEMOGLOBIN A1C
Hgb A1c MFr Bld: 5.3 % (ref 4.8–5.6)
Mean Plasma Glucose: 105.41 mg/dL

## 2018-02-14 MED ORDER — MIRTAZAPINE 7.5 MG PO TABS
7.5000 mg | ORAL_TABLET | Freq: Every day | ORAL | Status: DC
  Administered 2018-02-16 – 2018-02-17 (×2): 7.5 mg via ORAL
  Filled 2018-02-14 (×6): qty 1

## 2018-02-14 MED ORDER — CLONIDINE HCL 0.1 MG PO TABS
0.1000 mg | ORAL_TABLET | Freq: Every day | ORAL | Status: DC
  Filled 2018-02-14 (×2): qty 1

## 2018-02-14 MED ORDER — GABAPENTIN 300 MG PO CAPS
300.0000 mg | ORAL_CAPSULE | Freq: Three times a day (TID) | ORAL | Status: DC
  Administered 2018-02-14 – 2018-02-18 (×12): 300 mg via ORAL
  Filled 2018-02-14 (×18): qty 1

## 2018-02-14 MED ORDER — CLONIDINE HCL 0.1 MG PO TABS
0.1000 mg | ORAL_TABLET | ORAL | Status: AC
  Administered 2018-02-16 – 2018-02-17 (×3): 0.1 mg via ORAL
  Filled 2018-02-14 (×6): qty 1

## 2018-02-14 MED ORDER — MAGNESIUM HYDROXIDE 400 MG/5ML PO SUSP: 30.0000 mL | Freq: Every day | ORAL | Status: DC | PRN

## 2018-02-14 MED ORDER — NICOTINE 21 MG/24HR TD PT24
21.0000 mg | MEDICATED_PATCH | Freq: Every day | TRANSDERMAL | Status: DC
  Administered 2018-02-14 – 2018-02-18 (×5): 21 mg via TRANSDERMAL
  Filled 2018-02-14 (×7): qty 1

## 2018-02-14 MED ORDER — ONDANSETRON 4 MG PO TBDP: 4.0000 mg | ORAL_TABLET | Freq: Four times a day (QID) | ORAL | Status: DC | PRN

## 2018-02-14 MED ORDER — PNEUMOCOCCAL VAC POLYVALENT 25 MCG/0.5ML IJ INJ
0.5000 mL | INJECTION | INTRAMUSCULAR | Status: AC
  Administered 2018-02-15: 0.5 mL via INTRAMUSCULAR

## 2018-02-14 MED ORDER — DICYCLOMINE HCL 20 MG PO TABS: 20.0000 mg | ORAL_TABLET | Freq: Four times a day (QID) | ORAL | Status: DC | PRN

## 2018-02-14 MED ORDER — LOPERAMIDE HCL 2 MG PO CAPS: 2.0000 mg | ORAL_CAPSULE | ORAL | Status: DC | PRN

## 2018-02-14 MED ORDER — TRAZODONE HCL 50 MG PO TABS: 50.0000 mg | ORAL_TABLET | Freq: Every evening | ORAL | Status: DC | PRN

## 2018-02-14 MED ORDER — ENSURE ENLIVE PO LIQD
237.0000 mL | Freq: Two times a day (BID) | ORAL | Status: DC
  Administered 2018-02-14 – 2018-02-18 (×9): 237 mL via ORAL

## 2018-02-14 MED ORDER — GABAPENTIN 300 MG PO CAPS
300.0000 mg | ORAL_CAPSULE | Freq: Two times a day (BID) | ORAL | Status: DC
  Administered 2018-02-14: 300 mg via ORAL
  Filled 2018-02-14 (×5): qty 1

## 2018-02-14 MED ORDER — METHOCARBAMOL 500 MG PO TABS: 500.0000 mg | ORAL_TABLET | Freq: Three times a day (TID) | ORAL | Status: DC | PRN

## 2018-02-14 MED ORDER — HYDROXYZINE HCL 25 MG PO TABS
25.0000 mg | ORAL_TABLET | Freq: Four times a day (QID) | ORAL | Status: DC | PRN
  Administered 2018-02-17: 25 mg via ORAL
  Filled 2018-02-14: qty 1

## 2018-02-14 MED ORDER — NAPROXEN 500 MG PO TABS: 500.0000 mg | ORAL_TABLET | Freq: Two times a day (BID) | ORAL | Status: DC | PRN

## 2018-02-14 MED ORDER — ALUM & MAG HYDROXIDE-SIMETH 200-200-20 MG/5ML PO SUSP: 30.0000 mL | ORAL | Status: DC | PRN

## 2018-02-14 MED ORDER — ACETAMINOPHEN 325 MG PO TABS
650.0000 mg | ORAL_TABLET | Freq: Four times a day (QID) | ORAL | Status: DC | PRN
  Administered 2018-02-14 – 2018-02-18 (×9): 650 mg via ORAL
  Filled 2018-02-14 (×9): qty 2

## 2018-02-14 MED ORDER — CLONIDINE HCL 0.1 MG PO TABS
0.1000 mg | ORAL_TABLET | Freq: Four times a day (QID) | ORAL | Status: AC
  Administered 2018-02-14 – 2018-02-15 (×4): 0.1 mg via ORAL
  Filled 2018-02-14 (×8): qty 1

## 2018-02-14 NOTE — BHH Suicide Risk Assessment (Signed)
[facility] Admission Suicide Risk Assessment   Nursing information obtained from:  Patient Demographic factors:  Male, Divorced or widowed, Caucasian, Low socioeconomic status, Unemployed Current Mental Status:  NA Loss Factors:  Decline in physical health Historical Factors:  Family history of mental illness or substance abuse Risk Reduction Factors:  Living with another person, especially a relative, Positive social support  Total Time spent with patient: 30 minutes Principal Problem: Severe recurrent major depression without psychotic features ([place]) Diagnosis:   Patient Active Problem List   Diagnosis Date Noted  . Severe recurrent major depression without psychotic features ([place]) [F33.2] 02/14/2018  . Crack cocaine use [F14.90] 02/14/2018  . 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 Special screening for malignant neoplasms, colon [Z12.11] 01/14/2018  . COLLES' FRACTURE, RIGHT [S52.539A] 06/06/2007   Subjective Data: Patient is seen and examined.  Patient is a 52 year old male with a past psychiatric history significant for opiate dependence, cocaine use disorder, benzodiazepine use disorder, stimulant use disorder and past medical history significant for MS.  He presented to the [facility] emergency department accompanied by his sister.  He reported suicidality.  He stated he was living with his mother, and decided that he no longer wanted to abuse substances.  He stated he wanted help.  The last time he was in any substance abuse rehabilitation program was in 2009 by his report.  He admitted to helplessness, hopelessness and worthlessness.  He was admitted to the hospital for evaluation and stabilization.  Continued Clinical Symptoms:  Alcohol Use Disorder Identification Test Final Score (AUDIT): 0 The "Alcohol Use Disorders Identification Test", Guidelines for Use in Primary Care, Second Edition.  World Pharmacologist [facility]). Score between 0-7:  no or low risk or alcohol  related problems. Score between 8-15:  moderate risk of alcohol related problems. Score between 16-19:  high risk of alcohol related problems. Score 20 or above:  warrants further diagnostic evaluation for alcohol dependence and treatment.   CLINICAL FACTORS:   Depression:   Anhedonia Comorbid alcohol abuse/dependence Hopelessness Impulsivity Insomnia Alcohol/Substance Abuse/Dependencies   Musculoskeletal: Strength & Muscle Tone: within normal limits Gait & Station: normal Patient leans: N/A  Psychiatric Specialty Exam: Physical Exam  Nursing note and vitals reviewed. Constitutional: He is oriented to person, place, and time. He appears well-developed and well-nourished.  HENT:  Head: Normocephalic and atraumatic.  Respiratory: Effort normal.  Neurological: He is alert and oriented to person, place, and time.    ROS  Blood pressure (!) 85/65, pulse 62, temperature 98.2 F (36.8 C), temperature source Oral, resp. rate 18, height 5\' 6"  (1.676 m), weight 54 kg (119 lb).Body mass index is 19.21 kg/m.  General Appearance: Disheveled  Eye Contact:  Fair  Speech:  Slow  Volume:  Decreased  Mood:  Anxious and Depressed  Affect:  Congruent  Thought Process:  Coherent  Orientation:  Full (Time, Place, and Person)  Thought Content:  Logical  Suicidal Thoughts:  No  Homicidal Thoughts:  No  Memory:  Immediate;   Fair Recent;   Fair Remote;   Fair  Judgement:  Impaired  Insight:  Lacking  Psychomotor Activity:  Decreased  Concentration:  Concentration: Fair and Attention Span: Fair  Recall:  [employer] of Knowledge:  Fair  Language:  Fair  Akathisia:  Negative  Handed:  Right  AIMS (if indicated):     Assets:  Desire for Improvement Housing Resilience Social Support  ADL's:  Intact  Cognition:  WNL  Sleep:  Number of  Hours: 3.25      COGNITIVE FEATURES THAT CONTRIBUTE TO RISK:  None    SUICIDE RISK:   Minimal: No identifiable suicidal ideation.  Patients  presenting with no risk factors but with morbid ruminations; may be classified as minimal risk based on the severity of the depressive symptoms  PLAN OF CARE: Patient is seen and examined.  Patient is a 52 year old male with a past psychiatric history significant for polysubstance dependence, probable substance-induced mood disorder, and a past medical history significant for MS.  He presented to the hospital for evaluation.  He was found to be suicidal.  He will be admitted to the unit.  He will be integrated into the milieu.  He will be started on mirtazapine.  His gabapentin will be increased to 3 times daily.  He will be monitored for withdrawal symptoms.  He will be placed in the opiate withdrawal protocol.  He will be seen by social work.  He will be seen individually and also in groups.  We will explore possible treatment options for his substance abuse problems.  We will monitor for any complicated withdrawal symptoms.  I certify that inpatient services furnished can reasonably be expected to improve the patient's condition.   Sharma Covert, MD 02/14/2018, 4:01 PM

## 2018-02-14 NOTE — Progress Notes (Signed)
D   Pt endorses depression and anxiety    Pt complained of headache and said he has them more often since he was diagnosed with M.S.   His behavior is appropriate and he interacts well with select peers A    Verbal support given   Medications administered and effectiveness monitored    Q 15 min checks R    Pt is safe at present time

## 2018-02-14 NOTE — BHH Counselor (Signed)
Adult Comprehensive Assessment  Patient ID: Sean Kemp, male   DOB: [birth year], 52 y.o.   MRN: [id number]  Information Source: Information source: Patient  Current Stressors:  Physical health (include injuries & life threatening diseases): MS--2012; weight loss (significant); hx of brain surgery in 11/24/2010 absess in brain.   Living/Environment/Situation:  Living Arrangements: Parent Living conditions (as described by patient or guardian): good living conditions Who else lives in the home?: with mother How long has patient lived in current situation?: since 48when my dad passed away.  What is atmosphere in current home: Comfortable, Loving, Supportive  Family History:  Marital status: Divorced Divorced, when?: 2009/11/23 What types of issues is patient dealing with in the relationship?: "We were married for four years. " Additional relationship information: we don't talk. Are you sexually active?: Yes What is your sexual orientation?: heterosexual Has your sexual activity been affected by drugs, alcohol, medication, or emotional stress?: n/a  Does patient have children?: No  Childhood History:  By whom was/is the patient raised?: Both parents Additional childhood history information: parents were married throughout childhood. dad drank a bit but gave it up. Description of patient's relationship with caregiver when they were a child: close to both parents Patient's description of current relationship with people who raised him/her: close to mother; father died 2010/11/24.  How were you disciplined when you got in trouble as a child/adolescent?: whoopings Does patient have siblings?: Yes Number of Siblings: 5 Description of patient's current relationship with siblings: youngest of 52. "all of them are doing." Did patient suffer any verbal/emotional/physical/sexual abuse as a child?: No Did patient suffer from severe childhood neglect?: No Has patient ever been sexually  abused/assaulted/raped as an adolescent or adult?: No Was the patient ever a victim of a crime or a disaster?: No Witnessed domestic violence?: No Has patient been effected by domestic violence as an adult?: No  Education:  Highest grade of school patient has completed: 9th grade--quit and went to work.  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 Overtook them." hx alcohol abuse. pain pills took the place of drinking. smoking crack cocaine about 8 years ago. drug of choice-crack cocaine. very limited pain pills.  If attempted suicide, did drugs/alcohol play a role in this?: No(SI with plan to overdose but never had an attempt.) Alcohol/Substance Abuse Treatment Hx: Past Tx, Inpatient, Past Tx, Outpatient If yes, describe treatment: [facility] in [place] for 4 months in 11-24-07.  Has alcohol/substance abuse ever caused legal problems?: No  Social Support System:   Patient's Community Support System: Good Describe Community Support System: good network of friends; good family support Type of faith/religion: none How does patient's faith help to cope with current illness?:  n/a  Leisure/Recreation:  Leisure and Hobbies: "watch tv."  Strengths/Needs:   What is the patient's perception of their strengths?: motivated to get better Patient states they can use these personal strengths during their treatment to contribute to their recovery: "I don't know right now."  Patient states these barriers may affect/interfere with their treatment: none Patient states these barriers may affect their return to the community: none Other important information patient would like considered in planning for their treatment: n/a  Discharge Plan:   Currently receiving community mental health services: No Patient states concerns and preferences for aftercare planning are: none Patient states they will know when they are safe and ready for discharge when: "I want to stay long enough to feel like I'm sobered out and as long as they'll let me."  Does patient have access to transportation?: Yes(car and license. [employer]) Does patient have financial barriers related to discharge medications?: No Patient description of barriers related to discharge medications: none Will patient be returning to same living situation after discharge?: Yes  Summary/Recommendations:   Summary and Recommendations (to be completed by the evaluator): Patient is 52 yo male [facility] [place] [place] [facility] with his mother. Patient presents to the hospital seeking treatment for SI with plan to overdose, increased depressive symptoms, weight loss associated with drug use and MS diagnosis, and for medication stabilization. Patient currently denies SI/HI/AVH. He has a primary diangosis of MDD. Recommendations for patient include: crisis stabilization, therapeutic milieu, encourage group attendance and particiaption, medication management for detox/mood stabilization, and development of comprehensive mental wellness/sobriety plan. CSW assessing for appropriate referrals.   Avelina Laine LCSW 02/14/2018 9:39  AM

## 2018-02-14 NOTE — H&P (Signed)
Psychiatric Admission Assessment Adult   Patient Identification: Sean Kemp  MRN:  [id number] Date of Evaluation:  02/14/2018 Chief Complaint:  mdd severe without psychotic features substance induced mood disorder  cocaine use disorder severe  Principal Diagnosis: Severe recurrent major depression without psychotic features ([place])  Diagnosis:   Patient Active Problem List   Diagnosis Date Noted  . Severe recurrent major depression without psychotic features ([place]) [F33.2] 02/14/2018    Priority: High  . Crack cocaine use [F14.90] 02/14/2018    Priority: High  . Opiate abuse, episodic ([place]) [F11.10] 02/14/2018    Priority: High  . Special screening for malignant neoplasms, colon [Z12.11] 01/14/2018  . COLLES' FRACTURE, RIGHT [S52.539A] 06/06/2007   History of Present Illness: Sean Kemp is a 52 y.o. male presenting to [facility] after arriving at the ED yesterday with his sister. Pt presented with suicidal ideation secondary to life stressors and chronic >57yr episodic substance abuse including crack-cocaine a few times per week, and using 20-30 percocets daily (reportedly over 1 week ago since abuse). Pt reports that he started on pain pills for his back pain and later worked up to 10-15 pills three times daily. He states he has been off these for a week without any signs of withdrawal. Pt does report a good support system in that he lives with his mother, who helps him emotionally and financially after his divorce 8 years ago.  Today, pt seen and chart reviewed. Pt is alert/oriented x4, calm, cooperative, and appropriate to situation. Pt denies homicidal ideation and psychosis and does not appear to be responding to internal stimuli. Pt does endorse fleeting suicidal ideation without plan/intent today and is able to contract for safety. Pt is concerned about weight loss and has lost over 40 lbs in less than a year. Pt had a consult with a dietitian yesterday and Ensure was  recommended. Additionally, pt will start on Remeron this evening to help with appetite, depression, and his insomnia. Counseled pt on risk/benefit ratio and pt affirmed understanding.    Associated Signs/Symptoms: Depression Symptoms:  depressed mood, anhedonia, insomnia, impaired memory, recurrent thoughts of death, suicidal thoughts without plan, anxiety, loss of energy/fatigue, disturbed sleep, weight loss, decreased appetite, Was 165lbs now down to 119lbs in a 6 month period.  (Hypo) Manic Symptoms:  Distractibility, Impulsivity, Irritable Mood, Anxiety Symptoms:  Excessive Worry, Psychotic Symptoms:  Denies PTSD Symptoms: NA Total Time spent with patient: 45 minutes  Past Psychiatric History: polysubstance abuse, depression, insomnia  Is the patient at risk to self? Yes.    Has the patient been a risk to self in the past 6 months? Yes.    Has the patient been a risk to self within the distant past? Yes.    Is the patient a risk to others? No.  Has the patient been a risk to others in the past 6 months? No.  Has the patient been a risk to others within the distant past? No.   Prior Inpatient Therapy:   Prior Outpatient Therapy:    Alcohol Screening: 1. How often do you have a drink containing alcohol?: Never 2. How many drinks containing alcohol do you have on a typical day when you are drinking?: 1 or 2 3. How often do you have six or more drinks on one occasion?: Never AUDIT-C Score: 0 4. How often during the last year have you found that you were not able to stop drinking once you had started?: Never 5. How often during the last  year have you failed to do what was normally expected from you becasue of drinking?: Never 6. How often during the last year have you needed a first drink in the morning to get yourself going after a heavy drinking session?: Never 7. How often during the last year have you had a feeling of guilt of remorse after drinking?: Never 8. How  often during the last year have you been unable to remember what happened the night before because you had been drinking?: Never 9. Have you or someone else been injured as a result of your drinking?: No 10. Has a relative or friend or a doctor or another health worker been concerned about your drinking or suggested you cut down?: No Alcohol Use Disorder Identification Test Final Score (AUDIT): 0 Intervention/Follow-up: AUDIT Score <7 follow-up not indicated Substance Abuse History in the last 12 months:  Yes.   Consequences of Substance Abuse: mood lability Previous Psychotropic Medications: States he never tried anything for depression due to fears of sexual side effects Psychological Evaluations: Yes  Past Medical History:  Past Medical History:  Diagnosis Date  . Abscess    on brain  . Chronic back pain   . Drug abuse ([place])   . Kidney stones   . Multiple sclerosis ([place])   . Sciatica     Past Surgical History:  Procedure Laterality Date  . BRAIN SURGERY    . WRIST SURGERY     Family History: History reviewed. No pertinent family history. Family Psychiatric  History: depression Tobacco Screening: Have you used any form of tobacco in the last 30 days? (Cigarettes, Smokeless Tobacco, Cigars, and/or Pipes): Yes Tobacco use, Select all that apply: 5 or more cigarettes per day Are you interested in Tobacco Cessation Medications?: Yes, will notify MD for an order Counseled patient on smoking cessation including recognizing danger situations, developing coping skills and basic information about quitting provided: Refused/Declined practical counseling Social History:  Social History   Substance and Sexual Activity  Alcohol Use No     Social History   Substance and Sexual Activity  Drug Use Yes  . Types: Cocaine   Comment: adderall, pain pills, and gabapentin     Additional Social History: Marital status: Divorced Divorced, when?: 2011 What types of issues is patient dealing  with in the relationship?: "We were married for four years. " Additional relationship information: we don't talk. Are you sexually active?: Yes What is your sexual orientation?: heterosexual Has your sexual activity been affected by drugs, alcohol, medication, or emotional stress?: n/a  Does patient have children?: No    Pain Medications: See MAR Prescriptions: See MAR Over the Counter: See MAR History of alcohol / drug use?: Yes Longest period of sobriety (when/how long): 1-2 months at a time Name of Substance 1: Cocaine 1 - Age of First Use: 30s 1 - Amount (size/oz): varies 1 - Frequency: daily 1 - Duration: ongoing 1 - Last Use / Amount: 02/12/18 Name of Substance 2: "Pain Pills" 2 - Age of First Use: 30s 2 - Amount (size/oz): varies 2 - Frequency: pt states "whenever I can get them" 2 - Duration: ongoing 2 - Last Use / Amount: about 2 weeks ago                Allergies:   Allergies  Allergen Reactions  . Ultram [Tramadol Hcl] Hives  . Valium [Diazepam] Other (See Comments)    Blisters in mouth   Lab Results:  Results for orders placed or  performed during the hospital encounter of 02/14/18 (from the past 48 hour(s))  Hemoglobin A1c     Status: None   Collection Time: 02/14/18  6:43 AM  Result Value Ref Range   Hgb A1c MFr Bld 5.3 4.8 - 5.6 %    Comment: (NOTE) Pre diabetes:          5.7%-6.4% Diabetes:              >6.4% Glycemic control for   <7.0% adults with diabetes    Mean Plasma Glucose 105.41 mg/dL    Comment: Performed at [facility] [address] [place], [place] [address]  Lipid panel     Status: Abnormal   Collection Time: 02/14/18  6:43 AM  Result Value Ref Range   Cholesterol 161 0 - 200 mg/dL   Triglycerides 201 (H) <150 mg/dL   HDL 53 >40 mg/dL   Total CHOL/HDL Ratio 3.0 RATIO   VLDL 40 0 - 40 mg/dL   LDL Cholesterol 68 0 - 99 mg/dL    Comment:        Total Cholesterol/HDL:CHD Risk Coronary Heart Disease Risk Table                      Men   Women  1/2 Average Risk   3.4   3.3  Average Risk       5.0   4.4  2 X Average Risk   9.6   7.1  3 X Average Risk  23.4   11.0        Use the calculated Patient Ratio above and the CHD Risk Table to determine the patient's CHD Risk.        ATP III CLASSIFICATION (LDL):  <100     mg/dL   Optimal  100-129  mg/dL   Near or Above                    Optimal  130-159  mg/dL   Borderline  160-189  mg/dL   High  >190     mg/dL   Very High Performed at [facility] [address], [place], [place] [address]   TSH     Status: None   Collection Time: 02/14/18  6:43 AM  Result Value Ref Range   TSH 1.689 0.350 - 4.500 uIU/mL    Comment: Performed by a 3rd Generation assay with a functional sensitivity of <=0.01 uIU/mL. Performed at [facility], [place] [address], [place], [place] [address]     Blood Alcohol level:  Lab Results  Component Value Date   [facility] <10 02/13/2018   ETH <11 34/74/2595    Metabolic Disorder Labs:  Lab Results  Component Value Date   HGBA1C 5.3 02/14/2018   MPG 105.41 02/14/2018   No results found for: PROLACTIN Lab Results  Component Value Date   CHOL 161 02/14/2018   TRIG 201 (H) 02/14/2018   HDL 53 02/14/2018   CHOLHDL 3.0 02/14/2018   VLDL 40 02/14/2018   LDLCALC 68 02/14/2018    Current Medications: Current Facility-Administered Medications  Medication Dose Route Frequency Provider Last Rate Last Dose  . acetaminophen (TYLENOL) tablet 650 mg  650 mg Oral Q6H PRN Lindon Romp A, NP      . alum & mag hydroxide-simeth (MAALOX/MYLANTA) 200-200-20 MG/5ML suspension 30 mL  30 mL Oral Q4H PRN Lindon Romp A, NP      . cloNIDine (CATAPRES) tablet 0.1  mg  0.1 mg Oral QID Lindon Romp A, NP   0.1 mg at 02/14/18 1210   Followed by  . [START ON 02/16/2018] cloNIDine (CATAPRES) tablet 0.1 mg  0.1 mg Oral BH-qamhs Rozetta Nunnery, NP       Followed by  . [START ON 02/18/2018] cloNIDine (CATAPRES) tablet 0.1 mg   0.1 mg Oral QAC breakfast Lindon Romp A, NP      . dicyclomine (BENTYL) tablet 20 mg  20 mg Oral Q6H PRN Lindon Romp A, NP      . feeding supplement (ENSURE ENLIVE) (ENSURE ENLIVE) liquid 237 mL  237 mL Oral BID BM Lindon Romp A, NP   237 mL at 02/14/18 0825  . gabapentin (NEURONTIN) capsule 300 mg  300 mg Oral TID Benjamine Mola, FNP      . hydrOXYzine (ATARAX/VISTARIL) tablet 25 mg  25 mg Oral Q6H PRN Lindon Romp A, NP      . loperamide (IMODIUM) capsule 2-4 mg  2-4 mg Oral PRN Lindon Romp A, NP      . magnesium hydroxide (MILK OF MAGNESIA) suspension 30 mL  30 mL Oral Daily PRN Lindon Romp A, NP      . methocarbamol (ROBAXIN) tablet 500 mg  500 mg Oral Q8H PRN Lindon Romp A, NP      . mirtazapine (REMERON) tablet 7.5 mg  7.5 mg Oral QHS [place], Elyse Jarvis, FNP      . naproxen (NAPROSYN) tablet 500 mg  500 mg Oral BID PRN Lindon Romp A, NP      . nicotine (NICODERM CQ - dosed in mg/24 hours) patch 21 mg  21 mg Transdermal Daily Lindon Romp A, NP   21 mg at 02/14/18 0826  . ondansetron (ZOFRAN-ODT) disintegrating tablet 4 mg  4 mg Oral Q6H PRN Rozetta Nunnery, NP      . [START ON 02/15/2018] pneumococcal 23 valent vaccine (PNU-IMMUNE) injection 0.5 mL  0.5 mL Intramuscular Tomorrow-1000 Sharma Covert, MD      . traZODone (DESYREL) tablet 50 mg  50 mg Oral QHS PRN Rozetta Nunnery, NP       PTA Medications: Medications Prior to Admission  Medication Sig Dispense Refill Last Dose  . Aspirin-Acetaminophen-Caffeine (GOODY HEADACHE PO) Take 1 Package by mouth daily as needed (pain).   Unknown at Unknown time  . gabapentin (NEURONTIN) 300 MG capsule Take 300 mg by mouth 2 (two) times daily.   Unknown at Unknown time  . ibuprofen (ADVIL,MOTRIN) 200 MG tablet Take 200-400 mg by mouth every 8 (eight) hours as needed for headache, mild pain or moderate pain.   Unknown at Unknown time  . naproxen sodium (ALEVE) 220 MG tablet Take 440 mg by mouth daily as needed (headache).    Unknown at Unknown  time    Musculoskeletal: Strength & Muscle Tone: within normal limits Gait & Station: normal Patient leans: N/A  Psychiatric Specialty Exam: Physical Exam  Review of Systems  Psychiatric/Behavioral: Positive for depression and substance abuse. Negative for hallucinations and suicidal ideas. The patient is nervous/anxious and has insomnia.   All other systems reviewed and are negative.   Blood pressure (!) 85/65, pulse 62, temperature 98.2 F (36.8 C), temperature source Oral, resp. rate 18, height 5\' 6"  (1.676 m), weight 54 kg (119 lb).Body mass index is 19.21 kg/m.  General Appearance: Casual and Fairly Groomed  Eye Contact:  Good  Speech:  Clear and Coherent and Normal Rate  Volume:  Normal  Mood:  Anxious and Depressed  Affect:  Appropriate, Congruent and Depressed  Thought Process:  Coherent and Goal Directed  Orientation:  Full (Time, Place, and Person)  Thought Content:  Focused on treatment options  Suicidal Thoughts:  No, contracts for safety  Homicidal Thoughts:  No  Memory:  Immediate;   Fair Recent;   Fair Remote;   Fair  Judgement:  Fair  Insight:  Good  Psychomotor Activity:  Normal  Concentration:  Concentration: Fair and Attention Span: Fair  Recall:  [employer] of Knowledge:  Fair  Language:  Fair  Akathisia:  No  Handed:    AIMS (if indicated):     Assets:  Communication Skills Desire for Improvement Resilience Social Support  ADL's:  Intact  Cognition:  WNL  Sleep:  Number of Hours: 3.25    Treatment Plan Summary: Severe recurrent major depression without psychotic features ([place]) unstable, managed as below: -Crack-cocaine use, episodic, unstable, not addressed inpatient -Opiate abuse, episodic, unstable, managed as below  Medications:  -Opiate COWS protocol with clonidine taper -Continue nicotine patch  -Continue Trazodone 50mg  PO QHS PRN insomnia -Start Remeron 7.5mg  PO QHS for insomnia, depression, and low appetite -Continue Ensure per  dietitian consult from 02/13/18   Observation Level/Precautions:  15 minute checks  Laboratory:  Labs resulted, reviewed, and stable at this time.   Psychotherapy:  Group therapy, individual therapy, psychoeducation  Medications:  See MAR above  Consultations: None    Discharge Concerns: None    Estimated LOS: 5-7 days  Other:  N/A   Physician Treatment Plan for Primary Diagnosis: Severe recurrent major depression without psychotic features ([place]) Long Term Goal(s): Improvement in symptoms so as ready for discharge  Short Term Goals: Ability to identify changes in lifestyle to reduce recurrence of condition will improve, Ability to verbalize feelings will improve, Ability to disclose and discuss suicidal ideas, Ability to demonstrate self-control will improve, Ability to identify and develop effective coping behaviors will improve, Ability to maintain clinical measurements within normal limits will improve, Compliance with prescribed medications will improve and Ability to identify triggers associated with substance abuse/mental health issues will improve  Physician Treatment Plan for Secondary Diagnosis: Active Problems:   Severe recurrent major depression without psychotic features ([place])  Long Term Goal(s): Improvement in symptoms so as ready for discharge  Short Term Goals: Ability to identify changes in lifestyle to reduce recurrence of condition will improve, Ability to verbalize feelings will improve, Ability to disclose and discuss suicidal ideas, Ability to demonstrate self-control will improve, Ability to identify and develop effective coping behaviors will improve, Ability to maintain clinical measurements within normal limits will improve, Compliance with prescribed medications will improve and Ability to identify triggers associated with substance abuse/mental health issues will improve  I certify that inpatient services furnished can reasonably be expected to improve the patient's  condition.    Benjamine Mola, [place] 7/4/20192:02 PM

## 2018-02-14 NOTE — Tx Team (Signed)
Initial Treatment Plan 02/14/2018 3:21 AM Sherle Poe ECX:[id number]    PATIENT STRESSORS: Health problems Substance abuse   PATIENT STRENGTHS: Average or above average intelligence Capable of independent living General fund of knowledge Motivation for treatment/growth Supportive family/friends   PATIENT IDENTIFIED PROBLEMS: Medication abuse  Cocaine use  Depression  Risk for self harm    Need to leave drugs alone"  "Go to rehab when I leave here"         DISCHARGE CRITERIA:  Improved stabilization in mood, thinking, and/or behavior Motivation to continue treatment in a less acute level of care Need for constant or close observation no longer present Verbal commitment to aftercare and medication compliance Withdrawal symptoms are absent or subacute and managed without 24-hour nursing intervention  PRELIMINARY DISCHARGE PLAN: Attend aftercare/continuing care group Outpatient therapy Return to previous living arrangement  PATIENT/FAMILY INVOLVEMENT: This treatment plan has been presented to and reviewed with the patient, Sean Kemp, and/or family member.  The patient and family have been given the opportunity to ask questions and make suggestions.  Ronney Asters, RN 02/14/2018, 3:21 AM

## 2018-02-14 NOTE — Progress Notes (Addendum)
Nursing Progress Note: 7-7p  D- Mood is depressed and anxious. Affect is blunted and appropriate.Pt reports living with his mother and feeling depressed since his M.S diagnosis. 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 Continues to have difficulty staying asleep. Pt denies abusing benzos and can't understand how they showed up in his urine. Clonidine held due to b/p 89/61  A - Observed pt interacting in group and in the milieu.Support and encouragement offered, safety maintained with q 15 minutes.  R-Contracts for safety and continues to follow treatment plan, working on learning new coping skills.

## 2018-02-14 NOTE — Progress Notes (Signed)
Nutrition Brief Note  Patient identified on the Malnutrition Screening Tool (MST) Report  Wt Readings from Last 15 Encounters:  02/14/18 119 lb (54 kg)  02/13/18 117 lb (53.1 kg)  12/20/17 120 lb (54.4 kg)  01/14/17 120 lb (54.4 kg)  02/21/16 120 lb (54.4 kg)  06/11/15 140 lb (63.5 kg)  04/27/15 120 lb (54.4 kg)  11/29/14 140 lb (63.5 kg)  06/04/14 150 lb (68 kg)  05/31/14 150 lb (68 kg)  02/08/14 120 lb (54.4 kg)  04/07/13 125 lb (56.7 kg)  12/31/12 140 lb (63.5 kg)  10/08/12 125 lb (56.7 kg)  04/17/12 120 lb (54.4 kg)    Body mass index is 19.21 kg/m. Patient meets criteria for normal weight based on current BMI. He reports losing a lot of weight recently d/t decreased appetite. Patient has lost 1 lb in the past 2 months; this is not significant for time frame. He was voluntarily admitted with request for detox from pain pills. UDS was positive for cocaine.   Current diet order is Regular. Labs and medications reviewed.   No nutrition interventions warranted at this time. If nutrition issues arise, please consult RD.     Jarome Matin, MS, RD, LDN, [facility] Inpatient Clinical Dietitian Pager # [phone] After hours/weekend pager # [phone]

## 2018-02-14 NOTE — Progress Notes (Signed)
Vol admit, 52 yo caucasian male, admitted requesting help with detox from "pain pills".  He denied any other drug abuse on admission, but was positive for cocaine at the ED.  Pt reports several medical issues including chronic pain and MS.  Pt states he has lost a significant amount of weight recently d/t decreased appetite.  He has a hx of surgery to R side of his brain for an abscess in 2012.  Pt was cooperative with the admission process.  Paperwork was signed and the search completed.  Pt was given a sandwich tray on admission.  He was briefly oriented to the unit and went to bed after eating his sandwich.  Safety checks q15 minutes were initiated.

## 2018-02-15 NOTE — Progress Notes (Signed)
[facility] MD Progress Note  02/15/2018 3:21 PM Sean Kemp  MRN:  [id number] Subjective: I feel good I came in yesterday morning at 12:00.  I been thinking about things been doing and that I should not be doing since I got here.   Objective: 52 year old male presented to [facility] with his sister after endorsing suicidal ideations due to life stressors and chronic substance abuse including crack cocaine, pain medication.  He reports a history of chronic pain for his back and started taking medication several years ago.  He endorses that his drug use continue to worsen and developed financial issues that resulted in a divorce 8 years ago.  On evaluation today patient reports much improved since his admission.  He is able to show some positive insight to include reflecting on what led to his admission.  He is open to continuing outpatient therapy and went worth [place] which is close to his home.  He states that he is aware of his addiction now and is ready for change.  He denies any appetite disturbances, however reports poor sleeping habits due to his multiple sclerosis.  He does note much improvement with the gabapentin 900 mg p.o. 3 times daily for chronic pain, anxiety, and thoughts of suicidal ideation.  He denies any cravings, urges, self-harm behaviors and or suicidal thoughts at this time.  He is able to contract for safety while on the unit. Principal Problem: Severe recurrent major depression without psychotic features ([place]) Diagnosis:   Patient Active Problem List   Diagnosis Date Noted  . Severe recurrent major depression without psychotic features ([place]) [F33.2] 02/14/2018  . Crack cocaine use [F14.90] 02/14/2018  . Opiate abuse, episodic ([place]) [F11.10] 02/14/2018  . Substance induced mood disorder ([place]) [F19.94]   . Cocaine dependence with cocaine-induced anxiety disorder ([place]) [F14.280]   . Benzodiazepine dependence ([place]) [F13.20]   . Stimulant dependence ([place]) [F15.20]   . Opioid  dependence with opioid-induced mood disorder ([place]) [F11.24]   . Special screening for malignant neoplasms, colon [Z12.11] 01/14/2018  . COLLES' FRACTURE, RIGHT [S52.539A] 06/06/2007   Total Time spent with patient: 30 minutes  Past Psychiatric History:polysubstance abuse, depression, insomnia  Consequences of Substance Abuse: mood lability Previous Psychotropic Medications: States he never tried anything for depression due to fears of sexual side effects Psychological Evaluations: Yes    Past Medical History:  Past Medical History:  Diagnosis Date  . Abscess    on brain  . Chronic back pain   . Drug abuse ([place])   . Kidney stones   . Multiple sclerosis ([place])   . Sciatica     Past Surgical History:  Procedure Laterality Date  . BRAIN SURGERY    . WRIST SURGERY     Family History: History reviewed. No pertinent family history. Family Psychiatric  History: depression Social History:  Social History   Substance and Sexual Activity  Alcohol Use No     Social History   Substance and Sexual Activity  Drug Use Yes  . Types: Cocaine   Comment: adderall, pain pills, and gabapentin     Social History   Socioeconomic History  . Marital status: Single    Spouse name: Not on file  . Number of children: Not on file  . Years of education: Not on file  . Highest education level: Not on file  Occupational History  . Not on file  Social Needs  . Financial resource strain: Not on file  . Food insecurity:  Worry: Not on file    Inability: Not on file  . Transportation needs:    Medical: Not on file    Non-medical: Not on file  Tobacco Use  . Smoking status: Current Every Day Smoker    Packs/day: 0.50    Types: Cigarettes  . Smokeless tobacco: Never Used  . Tobacco comment: declined education  Substance and Sexual Activity  . Alcohol use: No  . Drug use: Yes    Types: Cocaine    Comment: adderall, pain pills, and gabapentin   . Sexual activity: Yes    Birth  control/protection: Condom  Lifestyle  . Physical activity:    Days per week: Not on file    Minutes per session: Not on file  . Stress: Not on file  Relationships  . Social connections:    Talks on phone: Not on file    Gets together: Not on file    Attends religious service: Not on file    Active member of club or organization: Not on file    Attends meetings of clubs or organizations: Not on file    Relationship status: Not on file  Other Topics Concern  . Not on file  Social History Narrative  . Not on file   Additional Social History:    Pain Medications: See MAR Prescriptions: See MAR Over the Counter: See MAR History of alcohol / drug use?: Yes Longest period of sobriety (when/how long): 1-2 months at a time Name of Substance 1: Cocaine 1 - Age of First Use: 30s 1 - Amount (size/oz): varies 1 - Frequency: daily 1 - Duration: ongoing 1 - Last Use / Amount: 02/12/18 Name of Substance 2: "Pain Pills" 2 - Age of First Use: 30s 2 - Amount (size/oz): varies 2 - Frequency: pt states "whenever I can get them" 2 - Duration: ongoing 2 - Last Use / Amount: about 2 weeks ago                Sleep: Fair  Appetite:  Fair  Current Medications: Current Facility-Administered Medications  Medication Dose Route Frequency Provider Last Rate Last Dose  . acetaminophen (TYLENOL) tablet 650 mg  650 mg Oral Q6H PRN Lindon Romp A, NP   650 mg at 02/15/18 1022  . alum & mag hydroxide-simeth (MAALOX/MYLANTA) 200-200-20 MG/5ML suspension 30 mL  30 mL Oral Q4H PRN Lindon Romp A, NP      . cloNIDine (CATAPRES) tablet 0.1 mg  0.1 mg Oral QID Lindon Romp A, NP   0.1 mg at 02/15/18 1152   Followed by  . [START ON 02/16/2018] cloNIDine (CATAPRES) tablet 0.1 mg  0.1 mg Oral BH-qamhs Rozetta Nunnery, NP       Followed by  . [START ON 02/18/2018] cloNIDine (CATAPRES) tablet 0.1 mg  0.1 mg Oral QAC breakfast Lindon Romp A, NP      . dicyclomine (BENTYL) tablet 20 mg  20 mg Oral Q6H PRN  Lindon Romp A, NP      . feeding supplement (ENSURE ENLIVE) (ENSURE ENLIVE) liquid 237 mL  237 mL Oral BID BM Lindon Romp A, NP   237 mL at 02/15/18 1451  . gabapentin (NEURONTIN) capsule 300 mg  300 mg Oral TID Benjamine Mola, FNP   300 mg at 02/15/18 1152  . hydrOXYzine (ATARAX/VISTARIL) tablet 25 mg  25 mg Oral Q6H PRN Lindon Romp A, NP      . loperamide (IMODIUM) capsule 2-4 mg  2-4 mg Oral PRN Gwenlyn Found,  Jason A, NP      . magnesium hydroxide (MILK OF MAGNESIA) suspension 30 mL  30 mL Oral Daily PRN Lindon Romp A, NP      . methocarbamol (ROBAXIN) tablet 500 mg  500 mg Oral Q8H PRN Lindon Romp A, NP      . mirtazapine (REMERON) tablet 7.5 mg  7.5 mg Oral QHS Withrow, Elyse Jarvis, FNP      . naproxen (NAPROSYN) tablet 500 mg  500 mg Oral BID PRN Lindon Romp A, NP      . nicotine (NICODERM CQ - dosed in mg/24 hours) patch 21 mg  21 mg Transdermal Daily Lindon Romp A, NP   21 mg at 02/15/18 0824  . ondansetron (ZOFRAN-ODT) disintegrating tablet 4 mg  4 mg Oral Q6H PRN Rozetta Nunnery, NP      . traZODone (DESYREL) tablet 50 mg  50 mg Oral QHS PRN Rozetta Nunnery, NP        Lab Results:  Results for orders placed or performed during the hospital encounter of 02/14/18 (from the past 48 hour(s))  Hemoglobin A1c     Status: None   Collection Time: 02/14/18  6:43 AM  Result Value Ref Range   Hgb A1c MFr Bld 5.3 4.8 - 5.6 %    Comment: (NOTE) Pre diabetes:          5.7%-6.4% Diabetes:              >6.4% Glycemic control for   <7.0% adults with diabetes    Mean Plasma Glucose 105.41 mg/dL    Comment: Performed at [facility], [place] [address] [place], [place] [address]  Lipid panel     Status: Abnormal   Collection Time: 02/14/18  6:43 AM  Result Value Ref Range   Cholesterol 161 0 - 200 mg/dL   Triglycerides 201 (H) <150 mg/dL   HDL 53 >40 mg/dL   Total CHOL/HDL Ratio 3.0 RATIO   VLDL 40 0 - 40 mg/dL   LDL Cholesterol 68 0 - 99 mg/dL    Comment:        Total Cholesterol/HDL:CHD  Risk Coronary Heart Disease Risk Table                     Men   Women  1/2 Average Risk   3.4   3.3  Average Risk       5.0   4.4  2 X Average Risk   9.6   7.1  3 X Average Risk  23.4   11.0        Use the calculated Patient Ratio above and the CHD Risk Table to determine the patient's CHD Risk.        ATP III CLASSIFICATION (LDL):  <100     mg/dL   Optimal  100-129  mg/dL   Near or Above                    Optimal  130-159  mg/dL   Borderline  160-189  mg/dL   High  >190     mg/dL   Very High Performed at [facility] [address], [place], [place] [address]   TSH     Status: None   Collection Time: 02/14/18  6:43 AM  Result Value Ref Range   TSH 1.689 0.350 - 4.500 uIU/mL    Comment: Performed by a 3rd Generation assay with a functional sensitivity of <=0.01  uIU/mL. Performed at [facility], [place] [address], [place], [place] [address]     Blood Alcohol level:  Lab Results  Component Value Date   [facility] <10 02/13/2018   ETH <11 29/47/6546    Metabolic Disorder Labs: Lab Results  Component Value Date   HGBA1C 5.3 02/14/2018   MPG 105.41 02/14/2018   No results found for: PROLACTIN Lab Results  Component Value Date   CHOL 161 02/14/2018   TRIG 201 (H) 02/14/2018   HDL 53 02/14/2018   CHOLHDL 3.0 02/14/2018   VLDL 40 02/14/2018   LDLCALC 68 02/14/2018    Physical Findings: AIMS: Facial and Oral Movements Muscles of Facial Expression: None, normal Lips and Perioral Area: None, normal Jaw: None, normal Tongue: None, normal,Extremity Movements Upper (arms, wrists, hands, fingers): None, normal Lower (legs, knees, ankles, toes): None, normal, Trunk Movements Neck, shoulders, hips: None, normal, Overall Severity Severity of abnormal movements (highest score from questions above): None, normal Incapacitation due to abnormal movements: None, normal Patient's awareness of abnormal movements (rate only patient's report): No  Awareness, Dental Status Current problems with teeth and/or dentures?: No Does patient usually wear dentures?: No  CIWA:    COWS:  COWS Total Score: 0  Musculoskeletal: Strength & Muscle Tone: within normal limits Gait & Station: normal Patient leans: N/A  Psychiatric Specialty Exam: Physical Exam  ROS  Blood pressure 102/69, pulse (!) 57, temperature 97.8 F (36.6 C), temperature source Oral, resp. rate 18, height 5\' 6"  (1.676 m), weight 54 kg (119 lb).Body mass index is 19.21 kg/m.  General Appearance: Fairly Groomed  Eye Contact:  Fair  Speech:  Clear and Coherent and Normal Rate  Volume:  Normal  Mood:  Depressed  Affect:  Depressed and Flat  Thought Process:  Coherent, Linear and Descriptions of Associations: Intact  Orientation:  Full (Time, Place, and Person)  Thought Content:  Logical  Suicidal Thoughts:  No  Homicidal Thoughts:  No  Memory:  Immediate;   Fair Recent;   Fair  Judgement:  Intact  Insight:  Present  Psychomotor Activity:  Normal  Concentration:  Concentration: Fair and Attention Span: Fair  Recall:  [employer] of Knowledge:  Fair  Language:  Fair  Akathisia:  No  Handed:  Right  AIMS (if indicated):     Assets:  Communication Skills Desire for Improvement Financial Resources/Insurance Leisure Time Physical Health Social Support Vocational/Educational  ADL's:  Intact  Cognition:  WNL  Sleep:  Number of Hours: 6.5     Treatment Plan Summary: Daily contact with patient to assess and evaluate symptoms and progress in treatment and Medication management  1 Admit for crisis management and stabilization.  2. Medication management to reduce symptoms to baseline and improved the patient's overall level of functioning. Closely monitor the side effects, efficacy and therapeutic response of medication.  Continue cows protocol with clonidine taper, nicotine patch, trazodone 50 mg.  Will start Remeron 7.5 mg p.o. nightly  for insomnia, depression and  low appetite 3. Treat health problem as indicated.  4. Developed treatment plan to decrease the risk of relapse upon discharge and to reduce the need for readmission.  5. Psychosocial education regarding relapse prevention in self-care.  6. Healthcare followup as needed for medical problems and called consults as indicated.  7. Increase collateral information.  8. Restart home medication where appropriate  9. Encouraged to participate and verbalize into group milieu therapy.     Nanci Pina, FNP 02/15/2018, 3:21 PM

## 2018-02-15 NOTE — BHH Group Notes (Signed)
[facility] Group Notes:  (Nursing/MHT/Case Management/Adjunct)  Date:  02/15/2018  Time:  4:00 pm  Type of Therapy:  Nurse Education  Participation Level:  Active  Participation Quality:  Appropriate and Attentive  Affect:  Appropriate  Cognitive:  Alert, Appropriate and Oriented  Insight:  Appropriate and Improving  Engagement in Group:  Developing/Improving and Engaged  Modes of Intervention:  Activity, Education and Problem-solving  Summary of Progress/Problems: pt active in the activity and supportive of others around him.   Otelia Limes Kable Haywood 02/15/2018, 5:42 PM

## 2018-02-15 NOTE — Tx Team (Signed)
Interdisciplinary Treatment and Diagnostic Plan Update  02/15/2018 Time of Session: [id number] JAEGER TRUEHEART MRN: [id number]  Principal Diagnosis: Severe recurrent major depression without psychotic features [facility])  Secondary Diagnoses: Principal Problem:   Severe recurrent major depression without psychotic features ([place]) Active Problems:   Crack cocaine use   Opiate abuse, episodic ([place])   Substance induced mood disorder ([place])   Cocaine dependence with cocaine-induced anxiety disorder ([place])   Benzodiazepine dependence ([place])   Stimulant dependence ([place])   Opioid dependence with opioid-induced mood disorder ([place])   Current Medications:  Current Facility-Administered Medications  Medication Dose Route Frequency Provider Last Rate Last Dose  . acetaminophen (TYLENOL) tablet 650 mg  650 mg Oral Q6H PRN Lindon Romp A, NP   650 mg at 02/14/18 1957  . alum & mag hydroxide-simeth (MAALOX/MYLANTA) 200-200-20 MG/5ML suspension 30 mL  30 mL Oral Q4H PRN Lindon Romp A, NP      . cloNIDine (CATAPRES) tablet 0.1 mg  0.1 mg Oral QID Rozetta Nunnery, NP   Stopped at 02/14/18 1758   Followed by  . [START ON 02/16/2018] cloNIDine (CATAPRES) tablet 0.1 mg  0.1 mg Oral BH-qamhs Rozetta Nunnery, NP       Followed by  . [START ON 02/18/2018] cloNIDine (CATAPRES) tablet 0.1 mg  0.1 mg Oral QAC breakfast Lindon Romp A, NP      . dicyclomine (BENTYL) tablet 20 mg  20 mg Oral Q6H PRN Lindon Romp A, NP      . feeding supplement (ENSURE ENLIVE) (ENSURE ENLIVE) liquid 237 mL  237 mL Oral BID BM Lindon Romp A, NP   237 mL at 02/14/18 1419  . gabapentin (NEURONTIN) capsule 300 mg  300 mg Oral TID Benjamine Mola, FNP   300 mg at 02/15/18 0630  . hydrOXYzine (ATARAX/VISTARIL) tablet 25 mg  25 mg Oral Q6H PRN Lindon Romp A, NP      . loperamide (IMODIUM) capsule 2-4 mg  2-4 mg Oral PRN Lindon Romp A, NP      . magnesium hydroxide (MILK OF MAGNESIA) suspension 30 mL  30 mL Oral Daily PRN Lindon Romp A, NP       . methocarbamol (ROBAXIN) tablet 500 mg  500 mg Oral Q8H PRN Lindon Romp A, NP      . mirtazapine (REMERON) tablet 7.5 mg  7.5 mg Oral QHS Withrow, Elyse Jarvis, FNP      . naproxen (NAPROSYN) tablet 500 mg  500 mg Oral BID PRN Lindon Romp A, NP      . nicotine (NICODERM CQ - dosed in mg/24 hours) patch 21 mg  21 mg Transdermal Daily Lindon Romp A, NP   21 mg at 02/15/18 0824  . ondansetron (ZOFRAN-ODT) disintegrating tablet 4 mg  4 mg Oral Q6H PRN Lindon Romp A, NP      . pneumococcal 23 valent vaccine (PNU-IMMUNE) injection 0.5 mL  0.5 mL Intramuscular Tomorrow-1000 Sharma Covert, MD      . traZODone (DESYREL) tablet 50 mg  50 mg Oral QHS PRN Rozetta Nunnery, NP       PTA Medications: Medications Prior to Admission  Medication Sig Dispense Refill Last Dose  . Aspirin-Acetaminophen-Caffeine (GOODY HEADACHE PO) Take 1 Package by mouth daily as needed (pain).   Unknown at Unknown time  . gabapentin (NEURONTIN) 300 MG capsule Take 300 mg by mouth 2 (two) times daily.   Unknown at Unknown time  . ibuprofen (ADVIL,MOTRIN) 200 MG tablet Take 200-400 mg by  mouth every 8 (eight) hours as needed for headache, mild pain or moderate pain.   Unknown at Unknown time  . naproxen sodium (ALEVE) 220 MG tablet Take 440 mg by mouth daily as needed (headache).    Unknown at Unknown time    Patient Stressors: Health problems Substance abuse  Patient Strengths: Average or above average intelligence Capable of independent living General fund of knowledge Motivation for treatment/growth Supportive family/friends  Treatment Modalities: Medication Management, Group therapy, Case management,  1 to 1 session with clinician, Psychoeducation, Recreational therapy.   Physician Treatment Plan for Primary Diagnosis: Severe recurrent major depression without psychotic features ([place]) Long Term Goal(s): Improvement in symptoms so as ready for discharge Improvement in symptoms so as ready for discharge   Short  Term Goals: Ability to identify changes in lifestyle to reduce recurrence of condition will improve Ability to verbalize feelings will improve Ability to disclose and discuss suicidal ideas Ability to demonstrate self-control will improve Ability to identify and develop effective coping behaviors will improve Ability to maintain clinical measurements within normal limits will improve Compliance with prescribed medications will improve Ability to identify triggers associated with substance abuse/mental health issues will improve Ability to identify changes in lifestyle to reduce recurrence of condition will improve Ability to verbalize feelings will improve Ability to disclose and discuss suicidal ideas Ability to demonstrate self-control will improve Ability to identify and develop effective coping behaviors will improve Ability to maintain clinical measurements within normal limits will improve Compliance with prescribed medications will improve Ability to identify triggers associated with substance abuse/mental health issues will improve  Medication Management: Evaluate patient's response, side effects, and tolerance of medication regimen.  Therapeutic Interventions: 1 to 1 sessions, Unit Group sessions and Medication administration.  Evaluation of Outcomes: Progressing  Physician Treatment Plan for Secondary Diagnosis: Principal Problem:   Severe recurrent major depression without psychotic features ([place]) Active Problems:   Crack cocaine use   Opiate abuse, episodic (H[facility])   Substance induced mood disorder (H[facility])   Cocaine dependence with cocaine-induced anxiety disorder (H[facility])   Benzodiazepine dependence ([facility])   Stimulant dependence ([place])   Opioid dependence with opioid-induced mood disorder ([place])  Long Term Goal(s): Improvement in symptoms so as ready for discharge Improvement in symptoms so as ready for discharge   Short Term Goals: Ability to identify changes in lifestyle to  reduce recurrence of condition will improve Ability to verbalize feelings will improve Ability to disclose and discuss suicidal ideas Ability to demonstrate self-control will improve Ability to identify and develop effective coping behaviors will improve Ability to maintain clinical measurements within normal limits will improve Compliance with prescribed medications will improve Ability to identify triggers associated with substance abuse/mental health issues will improve Ability to identify changes in lifestyle to reduce recurrence of condition will improve Ability to verbalize feelings will improve Ability to disclose and discuss suicidal ideas Ability to demonstrate self-control will improve Ability to identify and develop effective coping behaviors will improve Ability to maintain clinical measurements within normal limits will improve Compliance with prescribed medications will improve Ability to identify triggers associated with substance abuse/mental health issues will improve     Medication Management: Evaluate patient's response, side effects, and tolerance of medication regimen.  Therapeutic Interventions: 1 to 1 sessions, Unit Group sessions and Medication administration.  Evaluation of Outcomes: Progressing   RN Treatment Plan for Primary Diagnosis: Severe recurrent major depression without psychotic features ([place]) Long Term Goal(s): Knowledge of disease and therapeutic regimen to maintain health  will improve  Short Term Goals: Ability to remain free from injury will improve, Ability to verbalize feelings will improve and Ability to disclose and discuss suicidal ideas  Medication Management: RN will administer medications as ordered by provider, will assess and evaluate patient's response and provide education to patient for prescribed medication. RN will report any adverse and/or side effects to prescribing provider.  Therapeutic Interventions: 1 on 1 counseling sessions,  Psychoeducation, Medication administration, Evaluate responses to treatment, Monitor vital signs and CBGs as ordered, Perform/monitor CIWA, COWS, AIMS and Fall Risk screenings as ordered, Perform wound care treatments as ordered.  Evaluation of Outcomes: Progressing   LCSW Treatment Plan for Primary Diagnosis: Severe recurrent major depression without psychotic features ([place]) Long Term Goal(s): Safe transition to appropriate next level of care at discharge, Engage patient in therapeutic group addressing interpersonal concerns.  Short Term Goals: Engage patient in aftercare planning with referrals and resources, Facilitate patient progression through stages of change regarding substance use diagnoses and concerns and Identify triggers associated with mental health/substance abuse issues  Therapeutic Interventions: Assess for all discharge needs, 1 to 1 time with Social worker, Explore available resources and support systems, Assess for adequacy in community support network, Educate family and significant other(s) on suicide prevention, Complete Psychosocial Assessment, Interpersonal group therapy.  Evaluation of Outcomes: Progressing   Progress in Treatment: Attending groups: Yes. Participating in groups: Yes. Taking medication as prescribed: Yes. Toleration medication: Yes. Family/Significant other contact made: SPE completed with pt; pt declined to consent to collateral contact.  Patient understands diagnosis: Yes. Discussing patient identified problems/goals with staff: Yes. Medical problems stabilized or resolved: Yes. Denies suicidal/homicidal ideation: Yes. Issues/concerns per patient self-inventory: No. Other: n/a   New problem(s) identified: No, Describe:  n/a  New Short Term/Long Term Goal(s): detox, medication management for mood stabilization; elimination of SI thoughts; development of comprehensive mental wellness/sobriety plan.   Patient Goals:  "To get help for my  substance abuse and depression. I want to feel more stable before I leave the hospital."   Discharge Plan or Barriers: Pt plans to return home and has follow-up in place at [facility] on Wed, 02/20/18 at [place] pamphlet, Mobile Crisis information, and AA/NA information provided to patient for additional community support and resources.   Reason for Continuation of Hospitalization: Anxiety Depression Medication stabilization Suicidal ideation Withdrawal symptoms  Estimated Length of Stay: Tuesday, 02/19/18  Attendees: Patient: Sean Kemp 02/15/2018 9:40 AM  Physician: Dr. Leverne Humbles MD; Dr. Mallie Darting MD 02/15/2018 9:40 AM  Nursing: Santiago Glad RN; Legrand Como RN 02/15/2018 9:40 AM  RN Care Manager:x 02/15/2018 9:40 AM  Social Worker: Janice Norrie LCSW 02/15/2018 9:40 AM  Recreational Therapist: x 02/15/2018 9:40 AM  Other: Lindell Spar NP 02/15/2018 9:40 AM  Other:  02/15/2018 9:40 AM  Other: 02/15/2018 9:40 AM    Scribe for Treatment Team: Avelina Laine, LCSW 02/15/2018 9:40 AM

## 2018-02-15 NOTE — Progress Notes (Signed)
Recreation Therapy Notes  Date: 7.5.19 Time: 0930 Location: [address] Dayroom  Group Topic: Stress Management  Goal Area(s) Addresses:  Patient will verbalize importance of using healthy stress management.  Patient will identify positive emotions associated with healthy stress management.   Behavioral Response: Engaged  Intervention: Stress Management  Activity :  Meditation.  LRT introduced the stress management technique of meditation.  LRT played a meditation on resilience that allowed patients to focus on being able to withstand obstacles that arise.  Patients were to listen and follow along as meditation played to engage in the activity.  Education:  Stress Management, Discharge Planning.   Education Outcome: Acknowledges edcuation/In group clarification offered/Needs additional education  Clinical Observations/Feedback: Pt attended and participated in group.     Victorino Sparrow, LRT/CTRS         Ria Comment, Aailyah Dunbar A 02/15/2018 11:01 AM

## 2018-02-15 NOTE — Progress Notes (Signed)
Pt attended AA group this evening.  

## 2018-02-15 NOTE — Progress Notes (Signed)
Pt attended goals and orientation group this morning. Pt goal for the day is to work on a discharge plan and to take care of himself

## 2018-02-15 NOTE — Plan of Care (Signed)
Pt progressing in the following metrics  D: pt found in the dayroom this morning, assertive and pleasant in his demeanor. Pt complains of a headache with a rating of 9/10 that is chronic. Pt feels it is related to his ms. Pt rates his depression/hopelessness/anxiety a 7/5/5 respectively. Pt states he slept fair last night but appears well rested. Pt states his goal for today is to be addiction free and do everything he can to stay this way. Pt states getting back to his mother is a large influence in helping to keep him sober. Pt denies any si/hi/ah/vh and verbally agrees to approach staff if these become apparent.  A: pt provided support and encouragement. Pt provided medications per protocol and standing orders. PRN tylenol given for his headache along with instructions on talking with the doctor about his chronic headaches. Q50m safety checks implemented and continued.  R: pt safe on the unit. Will continue to monitor.   Problem: Education: Goal: Emotional status will improve Outcome: Progressing Goal: Mental status will improve Outcome: Progressing Goal: Verbalization of understanding the information provided will improve Outcome: Progressing   Problem: Activity: Goal: Interest or engagement in activities will improve Outcome: Progressing Goal: Sleeping patterns will improve Outcome: Progressing   Problem: Coping: Goal: Ability to verbalize frustrations and anger appropriately will improve Outcome: Progressing Goal: Ability to demonstrate self-control will improve Outcome: Progressing   Problem: Health Behavior/Discharge Planning: Goal: Compliance with treatment plan for underlying cause of condition will improve Outcome: Progressing   Problem: Physical Regulation: Goal: Ability to maintain clinical measurements within normal limits will improve Outcome: Progressing   Problem: Safety: Goal: Periods of time without injury will increase Outcome: Progressing   Problem: Physical  Regulation: Goal: Complications related to the disease process, condition or treatment will be avoided or minimized Outcome: Progressing   Problem: Safety: Goal: Ability to remain free from injury will improve Outcome: Progressing   Problem: Education: Goal: Utilization of techniques to improve thought processes will improve Outcome: Progressing Goal: Knowledge of the prescribed therapeutic regimen will improve Outcome: Progressing   Problem: Activity: Goal: Interest or engagement in leisure activities will improve Outcome: Progressing Goal: Imbalance in normal sleep/wake cycle will improve Outcome: Progressing   Problem: Health Behavior/Discharge Planning: Goal: Ability to make decisions will improve Outcome: Progressing   Problem: Role Relationship: Goal: Will demonstrate positive changes in social behaviors and relationships Outcome: Progressing   Problem: Safety: Goal: Ability to disclose and discuss suicidal ideas will improve Outcome: Progressing   Problem: Self-Concept: Goal: Will verbalize positive feelings about self Outcome: Progressing Goal: Level of anxiety will decrease Outcome: Progressing   Problem: Medication: Goal: Compliance with prescribed medication regimen will improve Outcome: Progressing   Problem: Self-Concept: Goal: Ability to disclose and discuss suicidal ideas will improve Outcome: Progressing

## 2018-02-16 MED ORDER — TRAZODONE HCL 100 MG PO TABS
100.0000 mg | ORAL_TABLET | Freq: Every evening | ORAL | Status: DC | PRN
  Administered 2018-02-16 – 2018-02-17 (×2): 100 mg via ORAL
  Filled 2018-02-16 (×2): qty 1

## 2018-02-16 NOTE — Progress Notes (Signed)
[facility] Sean Kemp Progress Note  02/16/2018 11:50 AM TILMON WISEHART  MRN:  [id number] Subjective: Patient is seen and examined.  Patient is a 52 year old male with a past psychiatric history significant for opiate dependence, cocaine dependence, benzodiazepine use disorder, stimulant use disorder and a past medical history significant for MS who was admitted on 02/14/2018 with suicidal ideation.  He is seen in follow-up.  He stated he is feeling better.  He stated his withdrawal symptoms were improving.  He is looking forward to remaining sober.  He denied any current suicidal ideation.  His vital signs have been stable.  He slept 6.5 hours last night.  He continues on Neurontin 300 mg p.o. 3 times daily, Robaxin 500 mg every 8 hours as needed, mirtazapine 7.5 mg p.o. nightly and trazodone as needed. Principal Problem: Severe recurrent major depression without psychotic features ([place]) Diagnosis:   Patient Active Problem List   Diagnosis Date Noted  . Severe recurrent major depression without psychotic features ([place]) [F33.2] 02/14/2018  . Crack cocaine use [F14.90] 02/14/2018  . Opiate abuse, episodic ([place]) [F11.10] 02/14/2018  . Substance induced mood disorder ([place]) [F19.94]   . Cocaine dependence with cocaine-induced anxiety disorder ([place]) [F14.280]   . Benzodiazepine dependence ([place]) [F13.20]   . Stimulant dependence ([place]) [F15.20]   . Opioid dependence with opioid-induced mood disorder ([place]) [F11.24]   . Special screening for malignant neoplasms, colon [Z12.11] 01/14/2018  . COLLES' FRACTURE, RIGHT [S52.539A] 06/06/2007   Total Time spent with patient: 20 minutes  Past Psychiatric History: See admission H&P  Past Medical History:  Past Medical History:  Diagnosis Date  . Abscess    on brain  . Chronic back pain   . Drug abuse ([place])   . Kidney stones   . Multiple sclerosis ([place])   . Sciatica     Past Surgical History:  Procedure Laterality Date  . BRAIN SURGERY    . WRIST SURGERY      Family History: History reviewed. No pertinent family history. Family Psychiatric  History: See admission H&P Social History:  Social History   Substance and Sexual Activity  Alcohol Use No     Social History   Substance and Sexual Activity  Drug Use Yes  . Types: Cocaine   Comment: adderall, pain pills, and gabapentin     Social History   Socioeconomic History  . Marital status: Single    Spouse name: Not on file  . Number of children: Not on file  . Years of education: Not on file  . Highest education level: Not on file  Occupational History  . Not on file  Social Needs  . Financial resource strain: Not on file  . Food insecurity:    Worry: Not on file    Inability: Not on file  . Transportation needs:    Medical: Not on file    Non-medical: Not on file  Tobacco Use  . Smoking status: Current Every Day Smoker    Packs/day: 0.50    Types: Cigarettes  . Smokeless tobacco: Never Used  . Tobacco comment: declined education  Substance and Sexual Activity  . Alcohol use: No  . Drug use: Yes    Types: Cocaine    Comment: adderall, pain pills, and gabapentin   . Sexual activity: Yes    Birth control/protection: Condom  Lifestyle  . Physical activity:    Days per week: Not on file    Minutes per session: Not on file  . Stress: Not on file  Relationships  . Social connections:    Talks on phone: Not on file    Gets together: Not on file    Attends religious service: Not on file    Active member of club or organization: Not on file    Attends meetings of clubs or organizations: Not on file    Relationship status: Not on file  Other Topics Concern  . Not on file  Social History Narrative  . Not on file   Additional Social History:    Pain Medications: See MAR Prescriptions: See MAR Over the Counter: See MAR History of alcohol / drug use?: Yes Longest period of sobriety (when/how long): 1-2 months at a time Name of Substance 1: Cocaine 1 - Age of First  Use: 30s 1 - Amount (size/oz): varies 1 - Frequency: daily 1 - Duration: ongoing 1 - Last Use / Amount: 02/12/18 Name of Substance 2: "Pain Pills" 2 - Age of First Use: 30s 2 - Amount (size/oz): varies 2 - Frequency: pt states "whenever I can get them" 2 - Duration: ongoing 2 - Last Use / Amount: about 2 weeks ago                Sleep: Good  Appetite:  Good  Current Medications: Current Facility-Administered Medications  Medication Dose Route Frequency Provider Last Rate Last Dose  . acetaminophen (TYLENOL) tablet 650 mg  650 mg Oral Q6H PRN Lindon Romp A, NP   650 mg at 02/15/18 1714  . alum & mag hydroxide-simeth (MAALOX/MYLANTA) 200-200-20 MG/5ML suspension 30 mL  30 mL Oral Q4H PRN Lindon Romp A, NP      . cloNIDine (CATAPRES) tablet 0.1 mg  0.1 mg Oral BH-qamhs Lindon Romp A, NP   0.1 mg at 02/16/18 0825   Followed by  . [START ON 02/18/2018] cloNIDine (CATAPRES) tablet 0.1 mg  0.1 mg Oral QAC breakfast Lindon Romp A, NP      . dicyclomine (BENTYL) tablet 20 mg  20 mg Oral Q6H PRN Lindon Romp A, NP      . feeding supplement (ENSURE ENLIVE) (ENSURE ENLIVE) liquid 237 mL  237 mL Oral BID BM Lindon Romp A, NP   237 mL at 02/16/18 1002  . gabapentin (NEURONTIN) capsule 300 mg  300 mg Oral TID Benjamine Mola, FNP   300 mg at 02/16/18 0740  . hydrOXYzine (ATARAX/VISTARIL) tablet 25 mg  25 mg Oral Q6H PRN Lindon Romp A, NP      . loperamide (IMODIUM) capsule 2-4 mg  2-4 mg Oral PRN Lindon Romp A, NP      . magnesium hydroxide (MILK OF MAGNESIA) suspension 30 mL  30 mL Oral Daily PRN Lindon Romp A, NP      . methocarbamol (ROBAXIN) tablet 500 mg  500 mg Oral Q8H PRN Lindon Romp A, NP      . mirtazapine (REMERON) tablet 7.5 mg  7.5 mg Oral QHS Withrow, Elyse Jarvis, FNP      . naproxen (NAPROSYN) tablet 500 mg  500 mg Oral BID PRN Lindon Romp A, NP      . nicotine (NICODERM CQ - dosed in mg/24 hours) patch 21 mg  21 mg Transdermal Daily Lindon Romp A, NP   21 mg at 02/16/18  0739  . ondansetron (ZOFRAN-ODT) disintegrating tablet 4 mg  4 mg Oral Q6H PRN Lindon Romp A, NP      . traZODone (DESYREL) tablet 100 mg  100 mg Oral QHS PRN Derrill Center, NP  Lab Results: No results found for this or any previous visit (from the past 48 hour(s)).  Blood Alcohol level:  Lab Results  Component Value Date   ETH <10 02/13/2018   ETH <11 05/06/3006    Metabolic Disorder Labs: Lab Results  Component Value Date   HGBA1C 5.3 02/14/2018   MPG 105.41 02/14/2018   No results found for: PROLACTIN Lab Results  Component Value Date   CHOL 161 02/14/2018   TRIG 201 (H) 02/14/2018   HDL 53 02/14/2018   CHOLHDL 3.0 02/14/2018   VLDL 40 02/14/2018   LDLCALC 68 02/14/2018    Physical Findings: AIMS: Facial and Oral Movements Muscles of Facial Expression: None, normal Lips and Perioral Area: None, normal Jaw: None, normal Tongue: None, normal,Extremity Movements Upper (arms, wrists, hands, fingers): None, normal Lower (legs, knees, ankles, toes): None, normal, Trunk Movements Neck, shoulders, hips: None, normal, Overall Severity Severity of abnormal movements (highest score from questions above): None, normal Incapacitation due to abnormal movements: None, normal Patient's awareness of abnormal movements (rate only patient's report): No Awareness, Dental Status Current problems with teeth and/or dentures?: No Does patient usually wear dentures?: No  CIWA:    COWS:  COWS Total Score: 1  Musculoskeletal: Strength & Muscle Tone: within normal limits Gait & Station: normal Patient leans: N/A  Psychiatric Specialty Exam: Physical Exam  Nursing note and vitals reviewed. Constitutional: He is oriented to person, place, and time. He appears well-developed and well-nourished.  HENT:  Head: Normocephalic and atraumatic.  Respiratory: Effort normal.  Neurological: He is alert and oriented to person, place, and time.    ROS  Blood pressure 103/70, pulse 63,  temperature 98.3 F (36.8 C), temperature source Oral, resp. rate 18, height 5\' 6"  (1.676 m), weight 54 kg (119 lb).Body mass index is 19.21 kg/m.  General Appearance: Casual  Eye Contact:  Fair  Speech:  Normal Rate  Volume:  Normal  Mood:  Euthymic  Affect:  Congruent  Thought Process:  Coherent  Orientation:  Full (Time, Place, and Person)  Thought Content:  Logical  Suicidal Thoughts:  No  Homicidal Thoughts:  No  Memory:  Immediate;   Fair Recent;   Fair Remote;   Fair  Judgement:  Intact  Insight:  Fair  Psychomotor Activity:  Normal  Concentration:  Concentration: Fair and Attention Span: Fair  Recall:  [employer] of Knowledge:  Fair  Language:  Good  Akathisia:  Negative  Handed:  Right  AIMS (if indicated):     Assets:  Communication Skills Desire for Improvement Housing Resilience Social Support  ADL's:  Intact  Cognition:  WNL  Sleep:  Number of Hours: 6.5     Treatment Plan Summary: Daily contact with patient to assess and evaluate symptoms and progress in treatment, Medication management and Plan Patient is seen and examined.  Patient is a 52 year old male with the above-stated past psychiatric history seen in follow-up.  His withdrawal symptoms are improving.  He denied suicidality today.  He seems to be headed in the right direction.  No change in current medications.  I would anticipate that he most likely will be discharged on 7/8.  Sharma Covert, Sean Kemp 02/16/2018, 11:50 AM

## 2018-02-16 NOTE — Progress Notes (Signed)
D. Pt observed sitting quietly with peers in dayroom this am. Pt is calm and cooperative friendly during interactions- voices no complaints of pain. Per pt's self inventory, pt rates his depression, hopelessness and anxiety a 4/2/5, respectively.  Pt currently denies SI/HI and AVH and agrees to contact staff before acting on any harmful thoughts.  A. Labs and vitals monitored. Pt compliant with medications. Pt supported emotionally and encouraged to express concerns and ask questions.   R. Pt remains safe with 15 minute checks. Will continue POC.

## 2018-02-16 NOTE — Progress Notes (Addendum)
Patient did not attend the evening speaker AA meeting. Pt was notified that group was beginning but returned to his room.   

## 2018-02-16 NOTE — Plan of Care (Signed)
Pt continues to progress towards goals and d/c. RN will continue to monitor.  

## 2018-02-16 NOTE — [facility] Group Notes (Signed)
[facility] Group Notes:  (Nursing)  Date:  02/16/2018  Time: 1:15 PM Type of Therapy:  Nurse Education  Participation Level:  Active  Participation Quality:  Appropriate and Attentive  Affect:  Appropriate  Cognitive:  Alert and Appropriate  Insight:  Appropriate  Engagement in Group:  Engaged  Modes of Intervention:  Discussion, Education and Exploration  Summary of Progress/Problems:  Nurse led group: Goals/Anger management  Waymond Cera 02/16/2018, 3:47 PM

## 2018-02-16 NOTE — B[facility] Group Notes (Signed)
LCSW Group Therapy Note  02/16/2018    10:30-11:30am   Type of Therapy and Topic:  Group Therapy: Anger and Coping Skills  Participation Level:  Active   Description of Group:   In this group, patients learned how to recognize the physical, cognitive, emotional, and behavioral responses they have to anger-provoking situations.  They identified how they usually or often react when angered, and learned how healthy and unhealthy coping skills work initially, but the unhealthy ones stop working.   They analyzed how their frequently-chosen coping skill is possibly beneficial and how it is possibly unhelpful.  The group discussed a variety of healthier coping skills that could help in resolving the actual issues, as well as how to go about planning for the the possibility of future similar situations.  Therapeutic Goals: 1. Patients will identify one thing that makes them angry and how they feel emotionally and physically, what their thoughts are or tend to be in those situations, and what healthy or unhealthy coping mechanism they typically use 2. Patients will identify how their coping technique works for them, as well as how it works against them. 3. Patients will explore possible new behaviors to use in future anger situations. 4. Patients will learn that anger itself is normal and cannot be eliminated, and that healthier coping skills can assist with resolving conflict rather than worsening situations.  Summary of Patient Progress:  The patient shared that he recently and often argues with his mother about his substance abuse and will says things he doesn't mean when angry.  He mostly listened in group but was attentive.  Therapeutic Modalities:   Cognitive Behavioral Therapy Motivation Interviewing  Maretta Los  .

## 2018-02-17 DIAGNOSIS — F332 Major depressive disorder, recurrent severe without psychotic features: Principal | ICD-10-CM

## 2018-02-17 DIAGNOSIS — Z79891 Long term (current) use of opiate analgesic: Secondary | ICD-10-CM

## 2018-02-17 NOTE — Progress Notes (Signed)
Nursing note 7p-7a  Pt observed interacting with peers on unit this shift. Displayed a flat/sad affect and but pleasant mood upon interaction with this Probation officer. Pt complains of having headache pain 9/10 see MAR for prn medication administration. Pt stated that he has chronic headaches as a result of having MS.  Pt denies SI/HI, and also denies audio or visual hallucinations at this time. Pt able to verbally contract for safety. Goal: "pain relief." Pt is now resting in bed with eyes closed with no signs or symptoms of pain or distress noted. Pt continues to remain safe on the unit and is observed by rounding every 15 min. RN will continue to monitor.

## 2018-02-17 NOTE — Progress Notes (Signed)
Patient did attend the evening speaker AA meeting.  

## 2018-02-17 NOTE — Progress Notes (Signed)
[facility] MD Progress Note  02/17/2018 4:53 PM Sean Kemp  MRN:  [id number]   Subjective: Sean Kemp is awake alert and oriented x3.  Seen resting in day room interacting with peers.  Reports feeling "groggy" due to recent medication adjustments to Remeron and trazodone.  Education provided with taking Remeron first and requesting trazodone as needed if not sleep by midnight.  Encourage patient to stay up throughout the day.  Reports he normally does not have a difficult time resting at home.  Denies suicidal or homicidal ideations.  Denies auditory or visual hallucinations.  Reports he has a follow-up plan at [facility] [place].  Patient rates his depression a 4 out of 10 during this assessment.  Reports a good appetite.  Support encouragement reassurance was provided.  History: Per assessment note, Sean Kemp a 52 y.o.malepresenting to Behavioral Health after arriving at the ED yesterday with his sister. Pt presented with suicidal ideation secondary to life stressors and chronic >59yr episodic substance abuse including crack-cocaine a few times per week, and using 20-30 percocets daily (reportedly over 1 week ago since abuse). Pt reports that he started on pain pills for his back pain and later worked up to 10-15 pills three times daily. He states he has been off these for a week without any signs of withdrawal. Pt does report a good support system in that he lives with his mother, who helps him emotionally and financially after his divorce 8 years ago    Principal Problem: Severe recurrent major depression without psychotic features ([place]) Diagnosis:   Patient Active Problem List   Diagnosis Date Noted  . Severe recurrent major depression without psychotic features ([place]) [F33.2] 02/14/2018  . Crack cocaine use [F14.90] 02/14/2018  . Opiate abuse, episodic ([place]) [F11.10] 02/14/2018  . Substance induced mood disorder ([place]) [F19.94]   . Cocaine dependence with cocaine-induced anxiety  disorder ([place]) [F14.280]   . Benzodiazepine dependence ([place]) [F13.20]   . Stimulant dependence ([place]) [F15.20]   . Opioid dependence with opioid-induced mood disorder ([place]) [F11.24]   . Special screening for malignant neoplasms, colon [Z12.11] 01/14/2018  . COLLES' FRACTURE, RIGHT [S52.539A] 06/06/2007   Total Time spent with patient: 20 minutes  Past Psychiatric History: See admission H&P  Past Medical History:  Past Medical History:  Diagnosis Date  . Abscess    on brain  . Chronic back pain   . Drug abuse ([place])   . Kidney stones   . Multiple sclerosis ([place])   . Sciatica     Past Surgical History:  Procedure Laterality Date  . BRAIN SURGERY    . WRIST SURGERY     Family History: History reviewed. No pertinent family history. Family Psychiatric  History: See admission H&P Social History:  Social History   Substance and Sexual Activity  Alcohol Use No     Social History   Substance and Sexual Activity  Drug Use Yes  . Types: Cocaine   Comment: adderall, pain pills, and gabapentin     Social History   Socioeconomic History  . Marital status: Single    Spouse name: Not on file  . Number of children: Not on file  . Years of education: Not on file  . Highest education level: Not on file  Occupational History  . Not on file  Social Needs  . Financial resource strain: Not on file  . Food insecurity:    Worry: Not on file    Inability: Not on file  . Transportation needs:  Medical: Not on file    Non-medical: Not on file  Tobacco Use  . Smoking status: Current Every Day Smoker    Packs/day: 0.50    Types: Cigarettes  . Smokeless tobacco: Never Used  . Tobacco comment: declined education  Substance and Sexual Activity  . Alcohol use: No  . Drug use: Yes    Types: Cocaine    Comment: adderall, pain pills, and gabapentin   . Sexual activity: Yes    Birth control/protection: Condom  Lifestyle  . Physical activity:    Days per week: Not on file     Minutes per session: Not on file  . Stress: Not on file  Relationships  . Social connections:    Talks on phone: Not on file    Gets together: Not on file    Attends religious service: Not on file    Active member of club or organization: Not on file    Attends meetings of clubs or organizations: Not on file    Relationship status: Not on file  Other Topics Concern  . Not on file  Social History Narrative  . Not on file   Additional Social History:    Pain Medications: See MAR Prescriptions: See MAR Over the Counter: See MAR History of alcohol / drug use?: Yes Longest period of sobriety (when/how long): 1-2 months at a time Name of Substance 1: Cocaine 1 - Age of First Use: 30s 1 - Amount (size/oz): varies 1 - Frequency: daily 1 - Duration: ongoing 1 - Last Use / Amount: 02/12/18 Name of Substance 2: "Pain Pills" 2 - Age of First Use: 30s 2 - Amount (size/oz): varies 2 - Frequency: pt states "whenever I can get them" 2 - Duration: ongoing 2 - Last Use / Amount: about 2 weeks ago                Sleep: Good  Appetite:  Good  Current Medications: Current Facility-Administered Medications  Medication Dose Route Frequency Provider Last Rate Last Dose  . acetaminophen (TYLENOL) tablet 650 mg  650 mg Oral Q6H PRN Lindon Romp A, NP   650 mg at 02/17/18 1622  . alum & mag hydroxide-simeth (MAALOX/MYLANTA) 200-200-20 MG/5ML suspension 30 mL  30 mL Oral Q4H PRN Lindon Romp A, NP      . cloNIDine (CATAPRES) tablet 0.1 mg  0.1 mg Oral BH-qamhs Lindon Romp A, NP   0.1 mg at 02/17/18 0810   Followed by  . [START ON 02/18/2018] cloNIDine (CATAPRES) tablet 0.1 mg  0.1 mg Oral QAC breakfast Lindon Romp A, NP      . dicyclomine (BENTYL) tablet 20 mg  20 mg Oral Q6H PRN Lindon Romp A, NP      . feeding supplement (ENSURE ENLIVE) (ENSURE ENLIVE) liquid 237 mL  237 mL Oral BID BM Lindon Romp A, NP   237 mL at 02/17/18 1621  . gabapentin (NEURONTIN) capsule 300 mg  300 mg Oral  TID Benjamine Mola, FNP   300 mg at 02/17/18 1622  . hydrOXYzine (ATARAX/VISTARIL) tablet 25 mg  25 mg Oral Q6H PRN Lindon Romp A, NP      . loperamide (IMODIUM) capsule 2-4 mg  2-4 mg Oral PRN Lindon Romp A, NP      . magnesium hydroxide (MILK OF MAGNESIA) suspension 30 mL  30 mL Oral Daily PRN Lindon Romp A, NP      . methocarbamol (ROBAXIN) tablet 500 mg  500 mg Oral Q8H PRN Gwenlyn Found,  Rinaldo Ratel, NP      . mirtazapine (REMERON) tablet 7.5 mg  7.5 mg Oral QHS [place], John C, FNP   7.5 mg at 02/16/18 2105  . naproxen (NAPROSYN) tablet 500 mg  500 mg Oral BID PRN Lindon Romp A, NP      . nicotine (NICODERM CQ - dosed in mg/24 hours) patch 21 mg  21 mg Transdermal Daily Lindon Romp A, NP   21 mg at 02/17/18 0811  . ondansetron (ZOFRAN-ODT) disintegrating tablet 4 mg  4 mg Oral Q6H PRN Lindon Romp A, NP      . traZODone (DESYREL) tablet 100 mg  100 mg Oral QHS PRN Derrill Center, NP   100 mg at 02/16/18 2105    Lab Results: No results found for this or any previous visit (from the past 48 hour(s)).  Blood Alcohol level:  Lab Results  Component Value Date   ETH <10 02/13/2018   ETH <11 82/50/5397    Metabolic Disorder Labs: Lab Results  Component Value Date   HGBA1C 5.3 02/14/2018   MPG 105.41 02/14/2018   No results found for: PROLACTIN Lab Results  Component Value Date   CHOL 161 02/14/2018   TRIG 201 (H) 02/14/2018   HDL 53 02/14/2018   CHOLHDL 3.0 02/14/2018   VLDL 40 02/14/2018   LDLCALC 68 02/14/2018    Physical Findings: AIMS: Facial and Oral Movements Muscles of Facial Expression: None, normal Lips and Perioral Area: None, normal Jaw: None, normal Tongue: None, normal,Extremity Movements Upper (arms, wrists, hands, fingers): None, normal Lower (legs, knees, ankles, toes): None, normal, Trunk Movements Neck, shoulders, hips: None, normal, Overall Severity Severity of abnormal movements (highest score from questions above): None, normal Incapacitation due to  abnormal movements: None, normal Patient's awareness of abnormal movements (rate only patient's report): No Awareness, Dental Status Current problems with teeth and/or dentures?: No Does patient usually wear dentures?: No  CIWA:    COWS:  COWS Total Score: 0  Musculoskeletal: Strength & Muscle Tone: within normal limits Gait & Station: normal Patient leans: N/A  Psychiatric Specialty Exam: Physical Exam  Nursing note and vitals reviewed. Constitutional: He is oriented to person, place, and time. He appears well-developed and well-nourished.  HENT:  Head: Normocephalic and atraumatic.  Respiratory: Effort normal.  Neurological: He is alert and oriented to person, place, and time.  Psychiatric: He has a normal mood and affect. His behavior is normal.    ROS  Blood pressure 117/76, pulse (!) 53, temperature 98.1 F (36.7 C), temperature source Oral, resp. rate 18, height 5\' 6"  (1.676 m), weight 54 kg (119 lb).Body mass index is 19.21 kg/m.  General Appearance: Casual  Eye Contact:  Fair  Speech:  Normal Rate  Volume:  Normal  Mood:  Euthymic  Affect:  Congruent  Thought Process:  Coherent  Orientation:  Full (Time, Place, and Person)  Thought Content:  Logical  Suicidal Thoughts:  No  Homicidal Thoughts:  No  Memory:  Immediate;   Fair Recent;   Fair Remote;   Fair  Judgement:  Intact  Insight:  Fair  Psychomotor Activity:  Normal  Concentration:  Concentration: Fair and Attention Span: Fair  Recall:  [employer] of Knowledge:  Fair  Language:  Good  Akathisia:  Negative  Handed:  Right  AIMS (if indicated):     Assets:  Communication Skills Desire for Improvement Housing Resilience Social Support  ADL's:  Intact  Cognition:  WNL  Sleep:  Number of  Hours: 6     Treatment Plan Summary: Daily contact with patient to assess and evaluate symptoms and progress in treatment and Medication management  Continue with current treatment plan on 02/17/2018 as listed  below except were noted.  Medication management: Continue with Neurontin 300 TID   daily for mood stabilization. Continue with Trazodone 100 mg for insomnia as needed Remeron 7.5 p.o. Nightly for insomnia Opiate detox protocol/cow's monitor  Will continue to monitor vitals ,medication compliance and treatment side effects while patient is here.  Reviewed labslipid panel 201elevated ,BAL - , UDS - pos for cocaineand benzodiazepines  CSW will continue working on disposition.  Patient to participate in therapeutic milieu  Derrill Center, NP 02/17/2018, 4:53 PM

## 2018-02-17 NOTE — Progress Notes (Signed)
  D. Pt presents with sad affect/mood-observed sitting quietly in dayroom this am- calm cooperative behavior and friendly during interactions. Pt reports that he slept poorly last night - endorses low energy and poor concentration. Per pt's self inventory, pt rates his depression, hopelessness and anxiety a 5/5/5, respectively. Pt writes that his most important goal today is "being drug free" and writes that he will "go to group" to meet that goal. Pt currently denies SI/HI and AVH and agrees to contact staff before acting on any harmful thoughts.  A. Labs and vitals monitored. Pt compliant with medications. Pt supported emotionally and encouraged to express concerns and ask questions.   R. Pt remains safe with 15 minute checks. Will continue POC.

## 2018-02-17 NOTE — Plan of Care (Signed)
  Problem: Education: Goal: Emotional status will improve Outcome: Progressing Goal: Mental status will improve Outcome: Progressing   

## 2018-02-17 NOTE — [facility] Group Notes (Signed)
[place] Group Notes:  (Nursing)  Date:  02/17/2018  Time  1:15 PM Type of Therapy:  Nurse Education  Participation Level:  Active  Participation Quality:  Appropriate and Attentive  Affect:  Appropriate  Cognitive:  Appropriate  Insight:  Appropriate  Engagement in Group:  Engaged  Modes of Intervention:  Discussion and Education  Summary of Progress/Problems:  Nurse led group: Coping Skills/ Relaxation Techniques   Waymond Cera 02/17/2018, 5:41 PM

## 2018-02-17 NOTE — B[facility] Group Notes (Signed)
[place] [facility] LCSW Group Therapy Note  02/17/2018  10:00-11:09 AM  Type of Therapy and Topic:  Group Therapy:  Building Supports  Participation Level:  Minimal  Description of Group:  Patients in this group were introduced to the idea of adding a variety of healthy supports to address the various needs in their lives. Patients explored unhealthy supports as well and discussed boundary setting with those. Different types of support were defined and described, and patients were asked to think of exampled of each type of support.  Patients discussed what additional healthy supports could be helpful in their recovery and wellness after discharge in order to prevent future hospitalizations. An emphasis was placed on following up with the discharge plan when they leave the hospital in order to continue becoming healthier and happier. Patients were provided a list of possible supports to add. Discussion was had and patients were able to learn from one another. Patients were asked to complete a handout that identified the support they have given and received. Discussion was had around the importance of balancing the two.  Therapeutic Goals: 1)  demonstrate the importance of adding supports  2)  discuss 4 definitions of support  3)  identify the patient's current level of healthy support and explore their hope for future support  4)  elicit commitments to add one healthy support   Summary of Patient Progress:  The patient was present for group but sat quietly in his seat. Patient did not engage in discussion or complete the handout but appeared to be listening to others attentively.   Therapeutic Modalities:   Motivational Interviewing Brief Solution-Focused Therapy  Tye Savoy

## 2018-02-18 DIAGNOSIS — Z23 Encounter for immunization: Secondary | ICD-10-CM | POA: Diagnosis not present

## 2018-02-18 MED ORDER — HYDROXYZINE HCL 25 MG PO TABS: 25.0000 mg | ORAL_TABLET | Freq: Four times a day (QID) | ORAL | 0 refills | Status: DC | PRN

## 2018-02-18 MED ORDER — TRAZODONE HCL 100 MG PO TABS: 100.0000 mg | ORAL_TABLET | Freq: Every evening | ORAL | 0 refills | Status: DC | PRN

## 2018-02-18 MED ORDER — MIRTAZAPINE 7.5 MG PO TABS: 7.5000 mg | ORAL_TABLET | Freq: Every day | ORAL | 0 refills | Status: DC

## 2018-02-18 MED ORDER — GABAPENTIN 300 MG PO CAPS: 300.0000 mg | ORAL_CAPSULE | Freq: Three times a day (TID) | ORAL | 0 refills | Status: AC

## 2018-02-18 NOTE — Progress Notes (Signed)
  [facility] Adult Case Management Discharge Plan :  Will you be returning to the same living situation after discharge:  Yes,  home At discharge, do you have transportation home?: Yes,  family member/sister possibly Do you have the ability to pay for your medications: Yes,  managed medicare  Release of information consent forms completed and submitted to medical records by CSW.  Patient to Follow up at: Follow-up Information    [facility] Follow up on 02/20/2018.   Why:  Hospital follow-up on Wednesday, 02/20/18 at 9:00AM. Please bring: photo ID, social security card, insurance card, and any proof of income if you have it. Thank you.  Contact information: [address] [place] [address] [phone]           Next level of care provider has access to [place] and Suicide Prevention discussed: Yes,  SPE completed with pt; pt declined to consent to collateral contact.   Have you used any form of tobacco in the last 30 days? (Cigarettes, Smokeless Tobacco, Cigars, and/or Pipes): Yes  Has patient been referred to the Quitline?: Patient refused referral  Patient has been referred for addiction treatment: Yes  Avelina Laine, LCSW 02/18/2018, 9:59 AM

## 2018-02-18 NOTE — Discharge Summary (Signed)
Physician Discharge Summary Note  Patient:  Sean Kemp is an 52 y.o., male MRN:  [id number] DOB:  1966-01-04 Patient phone:  [phone] (home)  Patient address:   [place] [address],  Total Time spent with patient: 30 minutes  Date of Admission:  02/14/2018 Date of Discharge: 02/18/2018  Reason for Admission:   Sean Kemp a 52 y.o.malepresenting to [place] after arriving at the ED yesterday with his sister. Pt presented with suicidal ideation secondary to life stressors and chronic >65yr episodic substance abuse including crack-cocaine a few times per week, and using 20-30 percocets daily (reportedly over 1 week ago since abuse). Pt reports that he started on pain pills for his back pain and later worked up to 10-15 pills three times daily. He states he has been off these for a week without any signs of withdrawal. Pt does Kemp a good support system in that he lives with his mother, who helps him emotionally and financially after his divorce 8 years ago.  Today, pt seen and chart reviewed. Pt is alert/oriented x4, calm, cooperative, and appropriate to situation. Pt denies homicidal ideation and psychosis and does not appear to be responding to internal stimuli. Pt does endorse fleeting suicidal ideation without plan/intent today and is able to contract for safety. Pt is concerned about weight loss and has lost over 40 lbs in less than a year. Pt had a consult with a dietitian yesterday and Ensure was recommended. Additionally, pt will start on Remeron this evening to help with appetite, depression, and his insomnia. Counseled pt on risk/benefit ratio and pt affirmed understanding.    Associated Signs/Symptoms: Depression Symptoms:  depressed mood, anhedonia, insomnia, impaired memory, recurrent thoughts of death, suicidal thoughts without plan, anxiety, loss of energy/fatigue, disturbed sleep, weight loss, decreased appetite, Was  165lbs now down to 119lbs in a 6 month period.  (Hypo) Manic Symptoms:  Distractibility, Impulsivity, Irritable Mood, Anxiety Symptoms:  Excessive Worry, Psychotic Symptoms:  Denies PTSD Symptoms: NA  Past Psychiatric History: polysubstance abuse, depression, insomnia Previous Psychotropic Medications: States he never tried anything for depression due to fears of sexual side effects   Principal Problem: Severe recurrent major depression without psychotic features [facility])   Discharge Diagnoses: Patient Active Problem List   Diagnosis Date Noted  . Severe recurrent major depression without psychotic features ([place]) [F33.2] 02/14/2018  . Crack cocaine use [F14.90] 02/14/2018  . Opiate abuse, episodic ([place]) [F11.10] 02/14/2018  . Substance induced mood disorder ([place]) [F19.94]   . Cocaine dependence with cocaine-induced anxiety disorder ([place]) [F14.280]   . Benzodiazepine dependence ([place]) [F13.20]   . Stimulant dependence ([place]) [F15.20]   . Opioid dependence with opioid-induced mood disorder ([place]) [F11.24]   . Special screening for malignant neoplasms, colon [Z12.11] 01/14/2018  . COLLES' FRACTURE, RIGHT [S52.539A] 06/06/2007    Past Medical History:  Past Medical History:  Diagnosis Date  . Abscess    on brain  . Chronic back pain   . Drug abuse ([place])   . Kidney stones   . Multiple sclerosis ([place])   . Sciatica     Past Surgical History:  Procedure Laterality Date  . BRAIN SURGERY    . WRIST SURGERY     Family History: History reviewed. No pertinent family history. Family Psychiatric  History: depression   Social History:  Social History   Substance and Sexual Activity  Alcohol Use No     Social History   Substance and Sexual Activity  Drug Use  Yes  . Types: Cocaine   Comment: adderall, pain pills, and gabapentin     Social History   Socioeconomic History  . Marital status: Single    Spouse name: Not on file  . Number of children: Not on file  . Years of  education: Not on file  . Highest education level: Not on file  Occupational History  . Not on file  Social Needs  . Financial resource strain: Not on file  . Food insecurity:    Worry: Not on file    Inability: Not on file  . Transportation needs:    Medical: Not on file    Non-medical: Not on file  Tobacco Use  . Smoking status: Current Every Day Smoker    Packs/day: 0.50    Types: Cigarettes  . Smokeless tobacco: Never Used  . Tobacco comment: declined education  Substance and Sexual Activity  . Alcohol use: No  . Drug use: Yes    Types: Cocaine    Comment: adderall, pain pills, and gabapentin   . Sexual activity: Yes    Birth control/protection: Condom  Lifestyle  . Physical activity:    Days per week: Not on file    Minutes per session: Not on file  . Stress: Not on file  Relationships  . Social connections:    Talks on phone: Not on file    Gets together: Not on file    Attends religious service: Not on file    Active member of club or organization: Not on file    Attends meetings of clubs or organizations: Not on file    Relationship status: Not on file  Other Topics Concern  . Not on file  Social History Narrative  . Not on file    Hospital Course:Sean Kemp was admitted for Severe recurrent major depression without psychotic features [facility]) and crisis management. He was treated with the following medications mirtazapine 7.5 mg p.o. nightly for depression, trazodone 100 mg p.o. nightly as needed for insomnia, gabapentin 300 mg p.o 3 times daily for anxiety and withdrawal symptoms.Sean Kemp was discharged with current medication and was instructed on how to take medications as prescribed; (details listed below under Medication List).  Medical problems were identified and treated as needed.  Home medications were restarted as appropriate.  Labs obtained during this admission have been assessed and reviewed.  All labs are determined to be within  normal with the exception of CBC notable for decreased red blood cell, and elevated MCV consistent with macrocytic anemia.  UDS positive for benzodiazepines and cocaine.  TSH 1.689, A1c 5.3 and lipid panel within normal with the exception of triglycerides at 201.  Improvement was monitored by observation and Sean Kemp of symptom reduction.  Emotional and mental status was monitored by daily self-inventory reports completed by Sean Kemp.         Sean Kemp was evaluated by the treatment team for stability and plans for continued recovery upon discharge.  Sean Kemp motivation was an integral factor for scheduling further treatment.  Employment, transportation, bed availability, health status, family support, and any pending legal issues were also considered during his hospital stay.  He was offered further treatment options upon discharge including but not limited to Residential, Intensive Outpatient, and Outpatient treatment.  Sean Kemp will follow up with the services as listed below under Follow Up Information.     Upon completion of  this admission the Sean Kemp was both mentally and medically stable for discharge denying suicidal/homicidal ideation, auditory/visual/tactile hallucinations, delusional thoughts and paranoia.        Physical Findings: AIMS: Facial and Oral Movements Muscles of Facial Expression: None, normal Lips and Perioral Area: None, normal Jaw: None, normal Tongue: None, normal,Extremity Movements Upper (arms, wrists, hands, fingers): None, normal Lower (legs, knees, ankles, toes): None, normal, Trunk Movements Neck, shoulders, hips: None, normal, Overall Severity Severity of abnormal movements (highest score from questions above): None, normal Incapacitation due to abnormal movements: None, normal Patient's awareness of abnormal movements (rate only patient's Kemp): No  Awareness, Dental Status Current problems with teeth and/or dentures?: No Does patient usually wear dentures?: No  CIWA:    COWS:  COWS Total Score: 1  Musculoskeletal: Strength & Muscle Tone: within normal limits Gait & Station: normal Patient leans: N/A  Psychiatric Specialty Exam: Physical Exam  ROS  Blood pressure 98/74, pulse 63, temperature 98.1 F (36.7 C), temperature source Oral, resp. rate 16, height 5\' 6"  (1.676 m), weight 54 kg (119 lb).Body mass index is 19.21 kg/m.  Sleep:  Number of Hours: 5.75     Have you used any form of tobacco in the last 30 days? (Cigarettes, Smokeless Tobacco, Cigars, and/or Pipes): Yes  Has this patient used any form of tobacco in the last 30 days? (Cigarettes, Smokeless Tobacco, Cigars, and/or Pipes)  No  Blood Alcohol level:  Lab Results  Component Value Date   ETH <10 02/13/2018   ETH <11 54/27/0623    Metabolic Disorder Labs:  Lab Results  Component Value Date   HGBA1C 5.3 02/14/2018   MPG 105.41 02/14/2018   No results found for: PROLACTIN Lab Results  Component Value Date   CHOL 161 02/14/2018   TRIG 201 (H) 02/14/2018   HDL 53 02/14/2018   CHOLHDL 3.0 02/14/2018   VLDL 40 02/14/2018   LDLCALC 68 02/14/2018    See Psychiatric Specialty Exam and Suicide Risk Assessment completed by Attending Physician prior to discharge.  Discharge destination:  Home  Is patient on multiple antipsychotic therapies at discharge:  No   Has Patient had three or more failed trials of antipsychotic monotherapy by history:  No  Recommended Plan for Multiple Antipsychotic Therapies: NA  Discharge Instructions    Discharge instructions   Complete by:  As directed    Please continue to take medications as directed. If your symptoms return, worsen, or persist please call your 911, Kemp to local ER, or contact crisis hotline. Please do not drink alcohol or use any illegal substances while taking prescription medications.     Allergies  as of 02/18/2018      Reactions   Ultram [tramadol Hcl] Hives   Valium [diazepam] Other (See Comments)   Blisters in mouth      Medication List    STOP taking these medications   ALEVE 220 MG tablet Generic drug:  naproxen sodium   GOODY HEADACHE PO   ibuprofen 200 MG tablet Commonly known as:  ADVIL,MOTRIN     TAKE these medications     Indication  gabapentin 300 MG capsule Commonly known as:  NEURONTIN Take 1 capsule (300 mg total) by mouth 3 (three) times daily. What changed:  when to take this  Indication:  Abuse or Misuse of Alcohol   hydrOXYzine 25 MG tablet Commonly known as:  ATARAX/VISTARIL Take 1 tablet (25 mg total) by mouth every 6 (six) hours as needed for anxiety.  Indication:  Feeling Anxious   mirtazapine 7.5 MG tablet Commonly known as:  REMERON Take 1 tablet (7.5 mg total) by mouth at bedtime.  Indication:  Major Depressive Disorder   traZODone 100 MG tablet Commonly known as:  DESYREL Take 1 tablet (100 mg total) by mouth at bedtime as needed for sleep.  Indication:  Trouble Sleeping      Follow-up Information    [facility] Follow up on 02/20/2018.   Why:  Hospital follow-up on Wednesday, 02/20/18 at 9:00AM. Please bring: photo ID, social security card, insurance card, and any proof of income if you have it. Thank you.  Contact information: [address] [place] [address] [phone]           Follow-up recommendations:  Activity:  Increase activity as tolerated. Diet:  Routine house diet as directed by outpatient physician. Tests:  Routine labs as suggested by outpatient physician.  All labs obtained while inpatient were normal with the exception of UDS which was positive for benzodiazepines and cocaine. Other:  Even if you began to feel better continue taking her medication as directed until further notice by outpatient psychiatrist.   Signed: Nanci Pina, FNP 02/18/2018, 9:47 AM

## 2018-02-18 NOTE — [facility] Suicide Risk Assessment (Signed)
[facility] INPATIENT:  Family/Significant Other Suicide Prevention Education  Suicide Prevention Education:  Patient Refusal for Family/Significant Other Suicide Prevention Education: The patient Sean Kemp has refused to provide written consent for family/significant other to be provided Family/Significant Other Suicide Prevention Education during admission and/or prior to discharge.  Physician notified.  SPE completed with pt, as pt refused to consent to family contact. SPI pamphlet provided to pt and pt was encouraged to share information with support network, ask questions, and talk about any concerns relating to SPE. Pt denies access to guns/firearms and verbalized understanding of information provided. Mobile Crisis information also provided to pt.   Avelina Laine LCSW 02/18/2018, 9:59 AM

## 2018-02-18 NOTE — Progress Notes (Signed)
Recreation Therapy Notes  Date: 7.8.19 Time: 0930 Location: [address] Dayroom  Group Topic: Stress Management  Goal Area(s) Addresses:  Patient will verbalize importance of using healthy stress management.  Patient will identify positive emotions associated with healthy stress management.   Behavioral Response: Engaged  Intervention: Stress Management  Activity :  Meditation.  LRT introduced the stress management technique of meditation.  LRT played a meditation on choice.  Patients were to listen and follow along as meditation played.  Education:  Stress Management, Discharge Planning.   Education Outcome: Acknowledges edcuation/In group clarification offered/Needs additional education  Clinical Observations/Feedback:  Pt attended and participated in group.     Victorino Sparrow, LRT/CTRS         Ria Comment, Neshawn Aird A 02/18/2018 11:43 AM

## 2018-02-18 NOTE — Progress Notes (Signed)
D: Pt A & O X 3 at time of assessment. Presents animated when engaged. Denies SI, HI, AVH and pain at this time.  Reports fair sleep and fair appetite with good concentration level. Emesis X3 this AM, consist of undigested food particles "I just can't chew my food well because I have bad teeth that's why. Rates his depression and hopelessness both 5/10, anxiety 7/10 "I'm ready to go home and see my family" coping skills including deep breathing encouraged. D/C home as ordered. Picked up in lobby by "my sister".  A: D/C instructions reviewed with pt including prescriptions and follow up appointment; compliance encouraged. All belongings from locker # 13 given to pt at time of departure. Fall precaution maintained without incident this shift. Scheduled and PRN medications given with verbal education and effects monitored. Safety checks maintained till time of d/c.  R: Pt receptive to care. Compliant with medications when offered. Denies adverse drug reactions when assessed. Verbalized understanding related to d/c instructions. Signed belonging sheet in agreement with items received from locker. Ambulatory with a steady gait. Appears to be in no physical distress at time of departure from facility.

## 2018-02-18 NOTE — Progress Notes (Addendum)
Pt was observed in the the dayroom, attending wrap-up group. Pt appears anxious/flat in affect and mood. Pt denies SI/HI/AVH/Pain at this time. Pt was minimal and guarded with interaction.Pt states he is not currently withdrawing; refused clonidine. Pt c/o of insomnia this evening. PRN vistrail and trazodone requested and given.Will continue with POC.

## 2018-02-18 NOTE — [facility] Suicide Risk Assessment (Signed)
[facility] Discharge Suicide Risk Assessment   Principal Problem: Severe recurrent major depression without psychotic features [facility]) Discharge Diagnoses:  Patient Active Problem List   Diagnosis Date Noted  . Severe recurrent major depression without psychotic features ([place]) [F33.2] 02/14/2018  . Crack cocaine use [F14.90] 02/14/2018  . Opiate abuse, episodic ([place]) [F11.10] 02/14/2018  . Substance induced mood disorder ([place]) [F19.94]   . Cocaine dependence with cocaine-induced anxiety disorder ([place]) [F14.280]   . Benzodiazepine dependence ([place]) [F13.20]   . Stimulant dependence ([place]) [F15.20]   . Opioid dependence with opioid-induced mood disorder ([place]) [F11.24]   . Special screening for malignant neoplasms, colon [Z12.11] 01/14/2018  . COLLES' FRACTURE, RIGHT [S52.539A] 06/06/2007    Total Time spent with patient: 30 minutes  Musculoskeletal: Strength & Muscle Tone: within normal limits Gait & Station: normal Patient leans: N/A  Psychiatric Specialty Exam: Review of Systems  All other systems reviewed and are negative.   Blood pressure 98/74, pulse 63, temperature 98.1 F (36.7 C), temperature source Oral, resp. rate 16, height 5\' 6"  (1.676 m), weight 54 kg (119 lb).Body mass index is 19.21 kg/m.  General Appearance: Casual  Eye Contact::  Fair  Speech:  Normal Rate409  Volume:  Normal  Mood:  Euthymic  Affect:  Appropriate  Thought Process:  Coherent  Orientation:  Full (Time, Place, and Person)  Thought Content:  Logical  Suicidal Thoughts:  No  Homicidal Thoughts:  No  Memory:  Immediate;   Fair Recent;   Fair Remote;   Fair  Judgement:  Intact  Insight:  Fair  Psychomotor Activity:  Normal  Concentration:  Fair  Recall:  [employer] of Knowledge:Good  Language: Good  Akathisia:  Negative  Handed:  Right  AIMS (if indicated):     Assets:  Communication Skills Desire for Improvement Financial Resources/Insurance Housing Resilience Social Support  Sleep:   Number of Hours: 5.75  Cognition: WNL  ADL's:  Intact   Mental Status Per Nursing Assessment::   On Admission:  NA  Demographic Factors:  Male, Divorced or widowed, Caucasian and Low socioeconomic status  Loss Factors: NA  Historical Factors: Impulsivity  Risk Reduction Factors:   Sense of responsibility to family, Living with another person, especially a relative and Positive social support  Continued Clinical Symptoms:  Alcohol/Substance Abuse/Dependencies  Cognitive Features That Contribute To Risk:  None    Suicide Risk:  Minimal: No identifiable suicidal ideation.  Patients presenting with no risk factors but with morbid ruminations; may be classified as minimal risk based on the severity of the depressive symptoms  Follow-up Information    [facility] Follow up on 02/20/2018.   Why:  Hospital follow-up on Wednesday, 02/20/18 at 9:00AM. Please bring: photo ID, social security card, insurance card, and any proof of income if you have it. Thank you.  Contact information: [place] [address] [phone]           Plan Of Care/Follow-up recommendations:  Activity:  ad lib  Sharma Covert, MD 02/18/2018, 10:59 AM

## 2018-02-28 DIAGNOSIS — J40 Bronchitis, not specified as acute or chronic: Secondary | ICD-10-CM | POA: Diagnosis not present

## 2018-02-28 DIAGNOSIS — G35 Multiple sclerosis: Secondary | ICD-10-CM | POA: Diagnosis not present

## 2018-02-28 DIAGNOSIS — R0600 Dyspnea, unspecified: Secondary | ICD-10-CM | POA: Diagnosis not present

## 2018-02-28 DIAGNOSIS — R202 Paresthesia of skin: Secondary | ICD-10-CM | POA: Diagnosis not present

## 2018-03-12 ENCOUNTER — Telehealth (INDEPENDENT_AMBULATORY_CARE_PROVIDER_SITE_OTHER): Payer: Self-pay | Admitting: *Deleted

## 2018-03-12 NOTE — Telephone Encounter (Signed)
agree

## 2018-03-12 NOTE — Telephone Encounter (Signed)
Referring MD/PCP: kim   Procedure: tcs  Reason/Indication:  screening  Has patient had this procedure before?  no  If so, when, by whom and where?    Is there a family history of colon cancer?  no  Who?  What age when diagnosed?    Is patient diabetic?   no      Does patient have prosthetic heart valve or mechanical valve?  no  Do you have a pacemaker?  no  Has patient ever had endocarditis? no  Has patient had joint replacement within last 12 months?  no  Is patient constipated or do they take laxatives? no  Does patient have a history of alcohol/drug use?  no  Is patient on blood thinner such as Coumadin, Plavix and/or Aspirin? no  Medications: gabapentin 100 mg tid  01/07/18 -- PER DR [facility] PATIENT DOES NOT NEED PROPOFOL  Allergies: ultram, valium  Medication Adjustment per Dr Lindi Adie, NP:   Procedure date & time: 04/11/18 at 1030

## 2018-04-11 ENCOUNTER — Ambulatory Visit (HOSPITAL_COMMUNITY): Admission: RE | Admit: 2018-04-11 | Payer: Medicare Other | Source: Ambulatory Visit | Admitting: Internal Medicine

## 2018-04-11 ENCOUNTER — Encounter (HOSPITAL_COMMUNITY): Admission: RE | Payer: Self-pay | Source: Ambulatory Visit

## 2018-04-11 SURGERY — COLONOSCOPY
Anesthesia: Moderate Sedation

## 2018-04-30 DIAGNOSIS — G43C1 Periodic headache syndromes in child or adult, intractable: Secondary | ICD-10-CM | POA: Diagnosis not present

## 2018-04-30 DIAGNOSIS — Z79899 Other long term (current) drug therapy: Secondary | ICD-10-CM | POA: Diagnosis not present

## 2018-04-30 DIAGNOSIS — R202 Paresthesia of skin: Secondary | ICD-10-CM | POA: Diagnosis not present

## 2018-04-30 DIAGNOSIS — G35 Multiple sclerosis: Secondary | ICD-10-CM | POA: Diagnosis not present

## 2018-05-01 DIAGNOSIS — M549 Dorsalgia, unspecified: Secondary | ICD-10-CM | POA: Diagnosis not present

## 2018-05-01 DIAGNOSIS — G4701 Insomnia due to medical condition: Secondary | ICD-10-CM | POA: Diagnosis not present

## 2018-05-01 DIAGNOSIS — R51 Headache: Secondary | ICD-10-CM | POA: Diagnosis not present

## 2018-06-17 ENCOUNTER — Emergency Department (HOSPITAL_COMMUNITY)
Admission: EM | Admit: 2018-06-17 | Discharge: 2018-06-17 | Disposition: A | Discharge status: Home / Self Care | Payer: Medicare Other | Attending: Emergency Medicine | Admitting: Emergency Medicine

## 2018-06-17 ENCOUNTER — Other Ambulatory Visit: Payer: Self-pay

## 2018-06-17 ENCOUNTER — Encounter (HOSPITAL_COMMUNITY): Payer: Self-pay | Admitting: Emergency Medicine

## 2018-06-17 DIAGNOSIS — Y999 Unspecified external cause status: Secondary | ICD-10-CM | POA: Insufficient documentation

## 2018-06-17 DIAGNOSIS — T24232A Burn of second degree of left lower leg, initial encounter: Secondary | ICD-10-CM | POA: Diagnosis not present

## 2018-06-17 DIAGNOSIS — Y92810 Car as the place of occurrence of the external cause: Secondary | ICD-10-CM | POA: Diagnosis not present

## 2018-06-17 DIAGNOSIS — X17XXXA Contact with hot engines, machinery and tools, initial encounter: Secondary | ICD-10-CM | POA: Insufficient documentation

## 2018-06-17 DIAGNOSIS — Y9389 Activity, other specified: Secondary | ICD-10-CM | POA: Insufficient documentation

## 2018-06-17 DIAGNOSIS — T24032A Burn of unspecified degree of left lower leg, initial encounter: Secondary | ICD-10-CM | POA: Diagnosis present

## 2018-06-17 DIAGNOSIS — Z79899 Other long term (current) drug therapy: Secondary | ICD-10-CM | POA: Insufficient documentation

## 2018-06-17 DIAGNOSIS — T300 Burn of unspecified body region, unspecified degree: Secondary | ICD-10-CM | POA: Diagnosis not present

## 2018-06-17 MED ORDER — SILVER SULFADIAZINE 1 % EX CREA: 1.0000 "application " | TOPICAL_CREAM | Freq: Every day | CUTANEOUS | 0 refills | Status: DC

## 2018-06-17 MED ORDER — SILVER SULFADIAZINE 1 % EX CREA
TOPICAL_CREAM | Freq: Once | CUTANEOUS | Status: AC
  Administered 2018-06-17: 1 via TOPICAL
  Filled 2018-06-17: qty 50

## 2018-06-17 NOTE — Discharge Instructions (Signed)
Apply Silvadene once daily to your burn.  You can take ibuprofen and Tylenol as needed for your pain, alternating, as prescribed over-the-counter.  Please see your doctor in 3 to 4 days for recheck of your wound.  Please return the emergency department if you develop any new or worsening symptoms including increasing pain, redness, swelling, drainage, red streaking from the area, fevers.

## 2018-06-17 NOTE — ED Triage Notes (Signed)
Patient states he woke up on last Friday with a blister to his L lower leg, thinks he got it against the muffler on his vehicle.

## 2018-06-17 NOTE — ED Provider Notes (Signed)
[facility] EMERGENCY DEPARTMENT Provider Note   CSN: [id number] Arrival date & time: 06/17/18  1426     History   Chief Complaint Chief Complaint  Patient presents with  . Burn    HPI Sean Kemp is a 52 y.o. male with history of MS, chronic back pain who presents with burn to left shin.  Patient reports he was working on his car 4 days ago when he remembers feeling some heat on his shin.  He woke up the next day with a blister on his shin.  He reports having decreased sensation to his feet and lower extremities from his MS.  He reports there was a blister, which popped and clear fluid came out.  He has had pain to the area.  No other drainage since the blister popped.  He denies any fevers.  HPI  Past Medical History:  Diagnosis Date  . Abscess    on brain  . Chronic back pain   . Drug abuse ([place])   . Kidney stones   . Multiple sclerosis ([place])   . Sciatica     Patient Active Problem List   Diagnosis Date Noted  . Severe recurrent major depression without psychotic features ([place]) 02/14/2018  . Crack cocaine use 02/14/2018  . Opiate abuse, episodic ([place]) 02/14/2018  . Substance induced mood disorder ([place])   . Cocaine dependence with cocaine-induced anxiety disorder ([place])   . Benzodiazepine dependence ([place])   . Stimulant dependence ([place])   . Opioid dependence with opioid-induced mood disorder ([place])   . Special screening for malignant neoplasms, colon 01/14/2018  . COLLES' FRACTURE, RIGHT 06/06/2007    Past Surgical History:  Procedure Laterality Date  . BRAIN SURGERY    . WRIST SURGERY          Home Medications    Prior to Admission medications   Medication Sig Start Date End Date Taking? Authorizing Provider  Cholecalciferol (VITAMIN D3) 2000 units TABS Take 2,000 Units by mouth daily.    [provider]  gabapentin (NEURONTIN) 300 MG capsule Take 1 capsule (300 mg total) by mouth 3 (three) times daily. 02/18/18   Starkes-Perry, Gayland Curry, FNP   hydrOXYzine (ATARAX/VISTARIL) 25 MG tablet Take 1 tablet (25 mg total) by mouth every 6 (six) hours as needed for anxiety. Patient not taking: Reported on 04/04/2018 02/18/18   Suella Broad, FNP  mirtazapine (REMERON) 7.5 MG tablet Take 1 tablet (7.5 mg total) by mouth at bedtime. Patient not taking: Reported on 04/04/2018 02/18/18   Suella Broad, FNP  silver sulfADIAZINE (SILVADENE) 1 % cream Apply 1 application topically daily. 06/17/18   Shaylah Mcghie, Bea Graff, PA-C  traZODone (DESYREL) 100 MG tablet Take 1 tablet (100 mg total) by mouth at bedtime as needed for sleep. Patient not taking: Reported on 04/04/2018 02/18/18   Suella Broad, FNP  vitamin B-12 (CYANOCOBALAMIN) 500 MCG tablet Take 500 mcg by mouth daily.    [provider]    Family History Family History  Problem Relation Age of Onset  . Valvular heart disease Mother   . Osteoporosis Mother     Social History Social History   Tobacco Use  . Smoking status: Current Every Day Smoker    Packs/day: 0.50    Types: Cigarettes  . Smokeless tobacco: Never Used  . Tobacco comment: declined education  Substance Use Topics  . Alcohol use: No  . Drug use: Not Currently    Types: Cocaine    Comment: adderall,  pain pills, and gabapentin      Allergies   Ultram [tramadol hcl] and Valium [diazepam]   Review of Systems Review of Systems  Constitutional: Negative for fever.  Skin: Positive for wound.  Neurological: Positive for numbness (baseline).     Physical Exam Updated Vital Signs BP 102/68 (BP Location: Right Arm)   Pulse 60   Temp 98 F (36.7 C) (Oral)   Resp 18   Ht 5\' 7"  (1.702 m)   Wt 59 kg   SpO2 98%   BMI 20.36 kg/m   Physical Exam  Constitutional: He appears well-developed and well-nourished. No distress.  HENT:  Head: Normocephalic and atraumatic.  Mouth/Throat: Oropharynx is clear and moist. No oropharyngeal exudate.  Eyes: Pupils are equal, round, and reactive to  light. Conjunctivae are normal. Right eye exhibits no discharge. Left eye exhibits no discharge. No scleral icterus.  Neck: Normal range of motion. Neck supple. No thyromegaly present.  Cardiovascular: Normal rate, regular rhythm, normal heart sounds and intact distal pulses. Exam reveals no gallop and no friction rub.  No murmur heard. Pulmonary/Chest: Effort normal and breath sounds normal. No stridor. No respiratory distress. He has no wheezes. He has no rales.  Abdominal: Soft. Bowel sounds are normal. He exhibits no distension. There is no tenderness. There is no rebound and no guarding.  Musculoskeletal: He exhibits no edema.  Lymphadenopathy:    He has no cervical adenopathy.  Neurological: He is alert. Coordination normal.  Skin: Skin is warm and dry. No rash noted. He is not diaphoretic. No pallor.  4x2 cm partial-thickness burn with mild surrounding erythema, no drainage; some tenderness right over the wound, but no surrounding tenderness extending past borders  Psychiatric: He has a normal mood and affect.  Nursing note and vitals reviewed.      ED Treatments / Results  Labs (all labs ordered are listed, but only abnormal results are displayed) Labs Reviewed - No data to display  EKG None  Radiology No results found.  Procedures Procedures (including critical care time)   Medications Ordered in ED Medications  silver sulfADIAZINE (SILVADENE) 1 % cream (1 application Topical Given 06/17/18 1523)     Initial Impression / Assessment and Plan / ED Course  I have reviewed the triage vital signs and the nursing notes.  Pertinent labs & imaging results that were available during my care of the patient were reviewed by me and considered in my medical decision making (see chart for details).     Patient with partial thickness burn of left shin.  Silvadene applied in the ED.  There are no signs of secondary infection at this time.  Patient advised to see PCP in 3 to 4  days for wound recheck.  Patient given strict return precautions.  Patient understands and agrees with plan.  Patient vitals stable throughout ED course and discharged in satisfactory condition.  Final Clinical Impressions(s) / ED Diagnoses   Final diagnoses:  Partial thickness burn of left lower leg, initial encounter    ED Discharge Orders         Ordered    silver sulfADIAZINE (SILVADENE) 1 % cream  Daily     06/17/18 1538           Frederica Kuster, PA-C 06/17/18 1618    Dorie Rank, MD 06/18/18 1614

## 2018-06-17 NOTE — ED Notes (Signed)
Telfa non stick dressing applied.

## 2018-07-15 ENCOUNTER — Emergency Department (HOSPITAL_COMMUNITY)
Admission: EM | Admit: 2018-07-15 | Discharge: 2018-07-15 | Disposition: A | Discharge status: Home / Self Care | Payer: Medicare Other

## 2018-10-16 DIAGNOSIS — Z79899 Other long term (current) drug therapy: Secondary | ICD-10-CM | POA: Diagnosis not present

## 2018-10-16 DIAGNOSIS — G890 Central pain syndrome: Secondary | ICD-10-CM | POA: Diagnosis not present

## 2018-10-16 DIAGNOSIS — N401 Enlarged prostate with lower urinary tract symptoms: Secondary | ICD-10-CM | POA: Diagnosis not present

## 2018-10-16 DIAGNOSIS — G35 Multiple sclerosis: Secondary | ICD-10-CM | POA: Diagnosis not present

## 2018-10-17 DIAGNOSIS — G35 Multiple sclerosis: Secondary | ICD-10-CM | POA: Diagnosis not present

## 2018-10-17 DIAGNOSIS — Z79899 Other long term (current) drug therapy: Secondary | ICD-10-CM | POA: Diagnosis not present

## 2018-10-17 DIAGNOSIS — D474 Osteomyelofibrosis: Secondary | ICD-10-CM | POA: Diagnosis not present

## 2018-10-17 DIAGNOSIS — Z9119 Patient's noncompliance with other medical treatment and regimen: Secondary | ICD-10-CM | POA: Diagnosis not present

## 2018-10-28 ENCOUNTER — Encounter (INDEPENDENT_AMBULATORY_CARE_PROVIDER_SITE_OTHER): Payer: Self-pay | Admitting: Nurse Practitioner

## 2018-10-28 DIAGNOSIS — Z113 Encounter for screening for infections with a predominantly sexual mode of transmission: Secondary | ICD-10-CM | POA: Diagnosis not present

## 2018-10-28 DIAGNOSIS — R5383 Other fatigue: Secondary | ICD-10-CM | POA: Diagnosis not present

## 2018-10-28 DIAGNOSIS — R630 Anorexia: Secondary | ICD-10-CM | POA: Diagnosis not present

## 2018-10-28 DIAGNOSIS — Z139 Encounter for screening, unspecified: Secondary | ICD-10-CM | POA: Diagnosis not present

## 2018-10-28 DIAGNOSIS — R69 Illness, unspecified: Secondary | ICD-10-CM | POA: Diagnosis not present

## 2018-10-28 DIAGNOSIS — R079 Chest pain, unspecified: Secondary | ICD-10-CM | POA: Diagnosis not present

## 2018-10-28 DIAGNOSIS — Z13220 Encounter for screening for lipoid disorders: Secondary | ICD-10-CM | POA: Diagnosis not present

## 2018-10-28 DIAGNOSIS — Z131 Encounter for screening for diabetes mellitus: Secondary | ICD-10-CM | POA: Diagnosis not present

## 2018-10-28 DIAGNOSIS — Z1159 Encounter for screening for other viral diseases: Secondary | ICD-10-CM | POA: Diagnosis not present

## 2018-11-01 ENCOUNTER — Encounter (INDEPENDENT_AMBULATORY_CARE_PROVIDER_SITE_OTHER): Payer: Self-pay | Admitting: Nurse Practitioner

## 2018-11-04 DIAGNOSIS — R69 Illness, unspecified: Secondary | ICD-10-CM | POA: Diagnosis not present

## 2018-11-04 DIAGNOSIS — J439 Emphysema, unspecified: Secondary | ICD-10-CM | POA: Diagnosis not present

## 2018-11-04 DIAGNOSIS — Z23 Encounter for immunization: Secondary | ICD-10-CM | POA: Diagnosis not present

## 2018-11-04 DIAGNOSIS — R630 Anorexia: Secondary | ICD-10-CM | POA: Diagnosis not present

## 2018-11-04 DIAGNOSIS — Z0000 Encounter for general adult medical examination without abnormal findings: Secondary | ICD-10-CM | POA: Diagnosis not present

## 2018-11-21 ENCOUNTER — Ambulatory Visit: Payer: Self-pay | Admitting: Cardiovascular Disease

## 2019-01-08 ENCOUNTER — Telehealth: Payer: Self-pay | Admitting: *Deleted

## 2019-01-08 NOTE — Telephone Encounter (Signed)
Pt contacted per Dr Bronson Ing. History reviewed. No symptoms to suggest any unstable cardiac conditions. Based on discussion, with current pandemic situation, we have postponed 01/10/19 appointment until 02/18/19. If symptoms change, pt has been instructed to contact our office. Pt doesn't have a smart phone to do video appt and denies any chest pain recently.

## 2019-01-10 ENCOUNTER — Ambulatory Visit: Payer: Self-pay | Admitting: Cardiovascular Disease

## 2019-02-04 ENCOUNTER — Ambulatory Visit (INDEPENDENT_AMBULATORY_CARE_PROVIDER_SITE_OTHER): Payer: Self-pay | Admitting: Internal Medicine

## 2019-02-11 ENCOUNTER — Encounter (HOSPITAL_COMMUNITY): Payer: Self-pay | Admitting: *Deleted

## 2019-02-11 ENCOUNTER — Other Ambulatory Visit: Payer: Self-pay

## 2019-02-11 ENCOUNTER — Emergency Department (HOSPITAL_COMMUNITY): Payer: Medicare Other

## 2019-02-11 ENCOUNTER — Emergency Department (HOSPITAL_COMMUNITY)
Admission: EM | Admit: 2019-02-11 | Discharge: 2019-02-11 | Disposition: A | Discharge status: Home / Self Care | Payer: Medicare Other | Attending: Emergency Medicine | Admitting: Emergency Medicine

## 2019-02-11 DIAGNOSIS — S51812A Laceration without foreign body of left forearm, initial encounter: Secondary | ICD-10-CM | POA: Insufficient documentation

## 2019-02-11 DIAGNOSIS — W25XXXA Contact with sharp glass, initial encounter: Secondary | ICD-10-CM | POA: Insufficient documentation

## 2019-02-11 DIAGNOSIS — G35 Multiple sclerosis: Secondary | ICD-10-CM | POA: Insufficient documentation

## 2019-02-11 DIAGNOSIS — Y93H3 Activity, building and construction: Secondary | ICD-10-CM | POA: Insufficient documentation

## 2019-02-11 DIAGNOSIS — F17210 Nicotine dependence, cigarettes, uncomplicated: Secondary | ICD-10-CM | POA: Diagnosis not present

## 2019-02-11 DIAGNOSIS — Y999 Unspecified external cause status: Secondary | ICD-10-CM | POA: Insufficient documentation

## 2019-02-11 DIAGNOSIS — Z79899 Other long term (current) drug therapy: Secondary | ICD-10-CM | POA: Insufficient documentation

## 2019-02-11 DIAGNOSIS — Y92009 Unspecified place in unspecified non-institutional (private) residence as the place of occurrence of the external cause: Secondary | ICD-10-CM | POA: Insufficient documentation

## 2019-02-11 MED ORDER — LIDOCAINE-EPINEPHRINE (PF) 1 %-1:200000 IJ SOLN
INTRAMUSCULAR | Status: AC
  Administered 2019-02-11: 30 mL
  Filled 2019-02-11: qty 30

## 2019-02-11 MED ORDER — IBUPROFEN 800 MG PO TABS: 800.0000 mg | ORAL_TABLET | Freq: Three times a day (TID) | ORAL | 0 refills | Status: AC

## 2019-02-11 NOTE — Discharge Instructions (Addendum)
Have your sutures removed in 10 days.  Keep your wound clean and dry,  Until a good scab forms - you may then wash gently twice daily with mild soap and water, but dry completely after.  Get rechecked for any sign of infection (redness,  Swelling,  Increased pain or drainage of purulent fluid). ° °

## 2019-02-11 NOTE — ED Triage Notes (Addendum)
Pt reports his left arm and right hand were cut by glass today when a storm door fell and he tried to catch it. Last tetanus shot within the last year. Pt c/o pain to left forearm with movement. Pt reports the frame of the door fell on his left forearm.

## 2019-02-12 NOTE — ED Provider Notes (Signed)
[facility] EMERGENCY DEPARTMENT Provider Note   CSN: [id number] Arrival date & time: 02/11/19  1403     History   Chief Complaint Chief Complaint  Patient presents with  . Extremity Laceration    HPI Sean Kemp is a 53 y.o. male with a history significant for MS, chronic back pain and drug abuse presenting with lacerations to his left forearm and right hand.  He was helping replace a window at his home when the glass broke causing laceration.  The injury occurred just prior to arrival.  He reports the wounds bled copiously but have improved with dressing and pressure.  He denies numbness or weakness distal to the injury sites.  He is current with his tetanus vaccine.     The history is provided by the patient.    Past Medical History:  Diagnosis Date  . Abscess    on brain  . Chronic back pain   . Drug abuse ([place])   . Kidney stones   . Multiple sclerosis ([place])   . Sciatica     Patient Active Problem List   Diagnosis Date Noted  . Severe recurrent major depression without psychotic features ([place]) 02/14/2018  . Crack cocaine use 02/14/2018  . Opiate abuse, episodic ([place]) 02/14/2018  . Substance induced mood disorder ([place])   . Cocaine dependence with cocaine-induced anxiety disorder ([place])   . Benzodiazepine dependence ([place])   . Stimulant dependence ([place])   . Opioid dependence with opioid-induced mood disorder ([place])   . Special screening for malignant neoplasms, colon 01/14/2018  . COLLES' FRACTURE, RIGHT 06/06/2007    Past Surgical History:  Procedure Laterality Date  . BRAIN SURGERY    . WRIST SURGERY          Home Medications    Prior to Admission medications   Medication Sig Start Date End Date Taking? Authorizing Provider  Cholecalciferol (VITAMIN D3) 2000 units TABS Take 2,000 Units by mouth daily.    [provider]  gabapentin (NEURONTIN) 300 MG capsule Take 1 capsule (300 mg total) by mouth 3 (three) times daily. 02/18/18    Starkes-Perry, Gayland Curry, FNP  hydrOXYzine (ATARAX/VISTARIL) 25 MG tablet Take 1 tablet (25 mg total) by mouth every 6 (six) hours as needed for anxiety. Patient not taking: Reported on 04/04/2018 02/18/18   Suella Broad, FNP  ibuprofen (ADVIL) 800 MG tablet Take 1 tablet (800 mg total) by mouth 3 (three) times daily. 02/11/19   Evalee Jefferson, PA-C  mirtazapine (REMERON) 7.5 MG tablet Take 1 tablet (7.5 mg total) by mouth at bedtime. Patient not taking: Reported on 04/04/2018 02/18/18   Suella Broad, FNP  silver sulfADIAZINE (SILVADENE) 1 % cream Apply 1 application topically daily. 06/17/18   Law, Bea Graff, PA-C  traZODone (DESYREL) 100 MG tablet Take 1 tablet (100 mg total) by mouth at bedtime as needed for sleep. Patient not taking: Reported on 04/04/2018 02/18/18   Suella Broad, FNP  vitamin B-12 (CYANOCOBALAMIN) 500 MCG tablet Take 500 mcg by mouth daily.    [provider]    Family History Family History  Problem Relation Age of Onset  . Valvular heart disease Mother   . Osteoporosis Mother     Social History Social History   Tobacco Use  . Smoking status: Current Every Day Smoker    Packs/day: 0.50    Types: Cigarettes  . Smokeless tobacco: Never Used  . Tobacco comment: declined education  Substance Use Topics  . Alcohol use:  No  . Drug use: Not Currently    Types: Cocaine    Comment: adderall, pain pills, and gabapentin      Allergies   Ultram [tramadol hcl] and Valium [diazepam]   Review of Systems Review of Systems  Constitutional: Negative for chills and fever.  Musculoskeletal: Negative.   Skin: Positive for wound.  Neurological: Negative for weakness and numbness.  All other systems reviewed and are negative.    Physical Exam Updated Vital Signs BP 114/79   Pulse 75   Temp 98.1 F (36.7 C) (Oral)   Resp 16   Ht 5\' 6"  (1.676 m)   Wt 54.4 kg   SpO2 100%   BMI 19.37 kg/m   Physical Exam Constitutional:       Appearance: He is well-developed.  HENT:     Head: Normocephalic.  Cardiovascular:     Rate and Rhythm: Normal rate.  Pulmonary:     Effort: Pulmonary effort is normal.  Musculoskeletal:        General: No tenderness.  Skin:    Findings: Laceration present.     Comments: Multiple small abrasions which appear superficial to the volar right hand and left dorsal hand.  4 cm flap laceration mid dorsal left forearm. Hemostatic. The base of flap is at the distal wound.  Wound is through the subcutaneous layer with no visualization of muscle or fascia.  Flap is well vascularized.  Neurological:     Mental Status: He is alert and oriented to person, place, and time.     Sensory: Sensation is intact. No sensory deficit.     Motor: Motor function is intact.      ED Treatments / Results  Labs (all labs ordered are listed, but only abnormal results are displayed) Labs Reviewed - No data to display  EKG None  Radiology Dg Forearm Left  Result Date: 02/11/2019 CLINICAL DATA:  Lacerations from glass. EXAM: LEFT FOREARM - 2 VIEW COMPARISON:  None. FINDINGS: There is no evidence of fracture or other focal bone lesions. Soft tissues are unremarkable. IMPRESSION: Negative. Electronically Signed   By: Dorise Bullion III M.D   On: 02/11/2019 16:23    Procedures Procedures (including critical care time)  LACERATION REPAIR Performed by: Evalee Jefferson Authorized by: Evalee Jefferson Consent: Verbal consent obtained. Risks and benefits: risks, benefits and alternatives were discussed Consent given by: patient Patient identity confirmed: provided demographic data Prepped and Draped in normal sterile fashion Wound explored  Laceration Location: left forearm  Laceration Length: 4 cm  No Foreign Bodies seen or palpated  Anesthesia: local infiltration  Local anesthetic: lidocaine 1% with epinephrine  Anesthetic total: 4 ml  Irrigation method: syringe Amount of cleaning: standard  Skin  closure: ethilon 4-0  Number of sutures: 11  Technique: simple interupted  Patient tolerance: Patient tolerated the procedure well with no immediate complications.   Medications Ordered in ED Medications  lidocaine-EPINEPHrine (XYLOCAINE-EPINEPHrine) 1 %-1:200000 (PF) injection (30 mLs  Given 02/11/19 1721)     Initial Impression / Assessment and Plan / ED Course  I have reviewed the triage vital signs and the nursing notes.  Pertinent labs & imaging results that were available during my care of the patient were reviewed by me and considered in my medical decision making (see chart for details).        Wound care instructions given.  Pt advised to have sutures removed in 10 days,  Return here sooner for any signs of infection including redness, swelling, worse  pain or drainage of pus.  xrays reviewed prior to wound repair.  Superficial abrasions cleaned and dressed.     Final Clinical Impressions(s) / ED Diagnoses   Final diagnoses:  Laceration of left forearm, initial encounter    ED Discharge Orders         Ordered    ibuprofen (ADVIL) 800 MG tablet  3 times daily     02/11/19 1726           Evalee Jefferson, PA-C 02/12/19 4627    Margette Fast, MD 02/12/19 [phone]

## 2019-02-13 ENCOUNTER — Emergency Department (HOSPITAL_COMMUNITY): Payer: Medicare Other

## 2019-02-13 ENCOUNTER — Emergency Department (HOSPITAL_COMMUNITY)
Admission: EM | Admit: 2019-02-13 | Discharge: 2019-02-13 | Disposition: A | Discharge status: Home / Self Care | Payer: Medicare Other | Attending: Emergency Medicine | Admitting: Emergency Medicine

## 2019-02-13 ENCOUNTER — Encounter (HOSPITAL_COMMUNITY): Payer: Self-pay

## 2019-02-13 ENCOUNTER — Other Ambulatory Visit: Payer: Self-pay

## 2019-02-13 DIAGNOSIS — S6991XS Unspecified injury of right wrist, hand and finger(s), sequela: Secondary | ICD-10-CM | POA: Diagnosis not present

## 2019-02-13 DIAGNOSIS — W228XXS Striking against or struck by other objects, sequela: Secondary | ICD-10-CM | POA: Insufficient documentation

## 2019-02-13 DIAGNOSIS — M79641 Pain in right hand: Secondary | ICD-10-CM | POA: Diagnosis present

## 2019-02-13 DIAGNOSIS — S6991XD Unspecified injury of right wrist, hand and finger(s), subsequent encounter: Secondary | ICD-10-CM

## 2019-02-13 DIAGNOSIS — F17210 Nicotine dependence, cigarettes, uncomplicated: Secondary | ICD-10-CM | POA: Insufficient documentation

## 2019-02-13 NOTE — ED Triage Notes (Signed)
Pt was here recently for an injury with a window to his right hand and left forearm. States that it is hard to move his right fingers and they hurt. Michela Pitcher that he wants an xray for it because it was not done the other day. No deformity or swelling noted

## 2019-02-13 NOTE — ED Triage Notes (Signed)
Pt said he took tylenol around 7pm

## 2019-02-13 NOTE — ED Provider Notes (Signed)
[facility] EMERGENCY DEPARTMENT Provider Note   CSN: [id number] Arrival date & time: 02/13/19  2004     History   Chief Complaint Chief Complaint  Patient presents with  . Hand Pain    right    HPI Sean Kemp is a 53 y.o. male with a history of polysubstance abuse & MS who presents to the emergency department with complaints of R hand pain x 2 days. Patient states that 2 days prior he replacing a window with heavy piece of glass and it slid down on the R hand then broke leading to laceration to L forearm & smaller wounds to R hand. Seen in the ED day of injury, had L forearm x-ray that was negative, laceration repair to L forearm wound & was discharged home. He states the forearm laceration has been doing well, but that he has had increased pain to the R hand where the heavier portion of glass stuck it- mostly to the 3rd finger. States pain is constant, worse with movement, 8/10 in severity, no alleviating factors.  Denies numbness, weakness, fever, chills, or purulent drainage/ erythema to wounds.  Last tetanus was 4-5 months ago. Patient is R hand dominant.     HPI  Past Medical History:  Diagnosis Date  . Abscess    on brain  . Chronic back pain   . Drug abuse ([place])   . Kidney stones   . Multiple sclerosis ([place])   . Sciatica     Patient Active Problem List   Diagnosis Date Noted  . Severe recurrent major depression without psychotic features ([place]) 02/14/2018  . Crack cocaine use 02/14/2018  . Opiate abuse, episodic ([place]) 02/14/2018  . Substance induced mood disorder ([place])   . 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 Benzodiazepine dependence ([place])   . Stimulant dependence ([place])   . Opioid dependence with opioid-induced mood disorder ([place])   . Special screening for malignant neoplasms, colon 01/14/2018  . COLLES' FRACTURE, RIGHT 06/06/2007    Past Surgical History:  Procedure Laterality Date  . BRAIN SURGERY    . WRIST SURGERY           Home Medications    Prior to Admission medications   Medication Sig Start Date End Date Taking? Authorizing Provider  Cholecalciferol (VITAMIN D3) 2000 units TABS Take 2,000 Units by mouth daily.    [provider]  gabapentin (NEURONTIN) 300 MG capsule Take 1 capsule (300 mg total) by mouth 3 (three) times daily. 02/18/18   Starkes-Perry, Gayland Curry, FNP  hydrOXYzine (ATARAX/VISTARIL) 25 MG tablet Take 1 tablet (25 mg total) by mouth every 6 (six) hours as needed for anxiety. Patient not taking: Reported on 04/04/2018 02/18/18   Suella Broad, FNP  ibuprofen (ADVIL) 800 MG tablet Take 1 tablet (800 mg total) by mouth 3 (three) times daily. 02/11/19   Evalee Jefferson, PA-C  mirtazapine (REMERON) 7.5 MG tablet Take 1 tablet (7.5 mg total) by mouth at bedtime. Patient not taking: Reported on 04/04/2018 02/18/18   Suella Broad, FNP  silver sulfADIAZINE (SILVADENE) 1 % cream Apply 1 application topically daily. 06/17/18   Law, Bea Graff, PA-C  traZODone (DESYREL) 100 MG tablet Take 1 tablet (100 mg total) by mouth at bedtime as needed for sleep. Patient not taking: Reported on 04/04/2018 02/18/18   Suella Broad, FNP  vitamin B-12 (CYANOCOBALAMIN) 500 MCG tablet Take 500 mcg by mouth daily.    [provider]  Family History Family History  Problem Relation Age of Onset  . Valvular heart disease Mother   . Osteoporosis Mother     Social History Social History   Tobacco Use  . Smoking status: Current Every Day Smoker    Packs/day: 0.50    Types: Cigarettes  . Smokeless tobacco: Never Used  . Tobacco comment: declined education  Substance Use Topics  . Alcohol use: No  . Drug use: Not Currently    Types: Cocaine    Comment: adderall, pain pills, and gabapentin      Allergies   Ultram [tramadol hcl] and Valium [diazepam]   Review of Systems Review of Systems  Constitutional: Negative for chills and fever.  Musculoskeletal: Positive  for arthralgias.  Skin: Positive for wound. Negative for color change.  Neurological: Negative for weakness and numbness.     Physical Exam Updated Vital Signs BP (!) 114/101 (BP Location: Right Arm)   Pulse 70   Temp 98.3 F (36.8 C) (Oral)   Ht 5\' 6"  (1.676 m)   Wt 54.4 kg   SpO2 96%   BMI 19.37 kg/m   Physical Exam Vitals signs and nursing note reviewed.  Constitutional:      General: He is not in acute distress.    Appearance: Normal appearance. He is not ill-appearing or toxic-appearing.  HENT:     Head: Normocephalic and atraumatic.  Neck:     Musculoskeletal: Normal range of motion and neck supple.     Comments: No midline tenderness.  Cardiovascular:     Rate and Rhythm: Normal rate.     Pulses:          Radial pulses are 2+ on the right side and 2+ on the left side.  Pulmonary:     Effort: No respiratory distress.     Breath sounds: Normal breath sounds.  Musculoskeletal:     Comments: Upper extremities: dorsum of L forearm there is a curved laceration with sutures in place that appears to be healing appropriately. Palmar aspect of R hand there are three small (<97mm) wounds- two to the 3rd finger & 1 to the palm. All wounds without erythema or purulent drainage, no warmth. No obvious deformity, appreciable swelling, or ecchymosis. Patient has intact AROM throughout. He is able to flex/extend wrist & all IP/MCP joints against resistance to bilateral upper extremities. Tender to palpation to the R 3rd metacarpal, MCP, IP joints, and all phalanges, also tender to the R 2nd MCP & proximal phalanx. NVI distally.   Skin:    General: Skin is warm and dry.     Capillary Refill: Capillary refill takes less than 2 seconds.  Neurological:     Mental Status: He is alert.     Comments: Alert. Clear speech. Sensation grossly intact to bilateral upper extremities. 5/5 symmetric grip strength. Able to perform OK sign, thumbs up, and cross 2nd/3rd digits bilaterally. Ambulatory.    Psychiatric:        Mood and Affect: Mood normal.        Behavior: Behavior normal.    ED Treatments / Results  Labs (all labs ordered are listed, but only abnormal results are displayed) Labs Reviewed - No data to display  EKG None  Radiology Dg Hand Complete Right  Result Date: 02/13/2019 CLINICAL DATA:  Injury, pain in 3rd through 5th digits EXAM: RIGHT HAND - COMPLETE 3+ VIEW COMPARISON:  06/15/2011 FINDINGS: Plate and screw fixation device in the distal right radius, unchanged. There is a fracture  noted through the distal right 5th metacarpal. This is age indeterminate but appears not acute with some apparent bridging callus. Fracture line remains evident. Findings may reflect subacute or chronic injury. Recommend clinical correlation. No subluxation or dislocation. IMPRESSION: Fracture through the distal right 5th metacarpal which may be subacute or chronic. Recommend clinical correlation. Electronically Signed   By: Rolm Baptise M.D.   On: 02/13/2019 22:21    Procedures Procedures (including critical care time)  Medications Ordered in ED Medications - No data to display   Initial Impression / Assessment and Plan / ED Course  I have reviewed the triage vital signs and the nursing notes.  Pertinent labs & imaging results that were available during my care of the patient were reviewed by me and considered in my medical decision making (see chart for details).   Patient presents to the ED w/ complaints of R hand pain s/p injury 2 days prior. Nontoxic appearing. Small wounds noted to the R hand- none appear to have obvious FB, none appear to require closure, no signs of infection- not consistent w/ cellulitis, abscess, septic joint or flexor tenosynovitis. Intact AROM. Tender over R 3rd/2nd MCP area extending to 3rd digit & metacarpal. Xray w/o acute fx/dislocation or radiopaque FB- subacute/chronic 5th metacarpal fx noted- patient nontender to this area, non acute, did punch  something a few months ago- likely from this time. Will discharge home w/ PCP follow up. Recommended PRICE. I discussed results, treatment plan, need for follow-up, and return precautions with the patient. Provided opportunity for questions, patient confirmed understanding and is in agreement with plan.    Findings and plan of care discussed with supervising physician Dr. Eulis Foster who is in agreement.   Final Clinical Impressions(s) / ED Diagnoses   Final diagnoses:  Injury of right hand, subsequent encounter    ED Discharge Orders    None       Amaryllis Dyke, PA-C 02/13/19 2349    Daleen Bo, MD 02/18/19 1555

## 2019-02-13 NOTE — Discharge Instructions (Addendum)
You are seen in the emergency department for evaluation of hand pain.  Your x-ray did not show any new fractures or dislocations, it did show an old fracture to your right fifth metacarpal, this is a bone near your knuckle.   Please of apply ice wrapped in a towel 20 minutes on 40 minutes off the next 24 hours.  Please take over-the-counter pain medicine to help with discomfort.  Please elevate the right hand.  Please try to rest.  Follow-up with primary care within 1 week for reevaluation.  Return to the ER for new or worsening symptoms or any other concerns.

## 2019-02-18 ENCOUNTER — Ambulatory Visit: Payer: Medicare Other | Admitting: Cardiovascular Disease

## 2019-02-27 IMAGING — MR MR CERVICAL SPINE WO/W CM
4 of 8 series · 17 of 48 positions shown · IV contrast (multihance)
Comparison: None.

CLINICAL DATA: Multiple sclerosis

EXAM:
MRI CERVICAL SPINE WITHOUT AND WITH CONTRAST
TECHNIQUE: Multiplanar and multiecho pulse sequences of the cervical spine, to
include the craniocervical junction and cervicothoracic junction,
were obtained without and with intravenous contrast.
CONTRAST:  10mL MULTIHANCE GADOBENATE DIMEGLUMINE 529 MG/ML IV SOLN

[Series 7: T2 · axial · 3.0mm · 0.20mm/px · z∈[-181,-82]mm · 8 of 36 slices shown (1 of 2)]
[im 1/36]
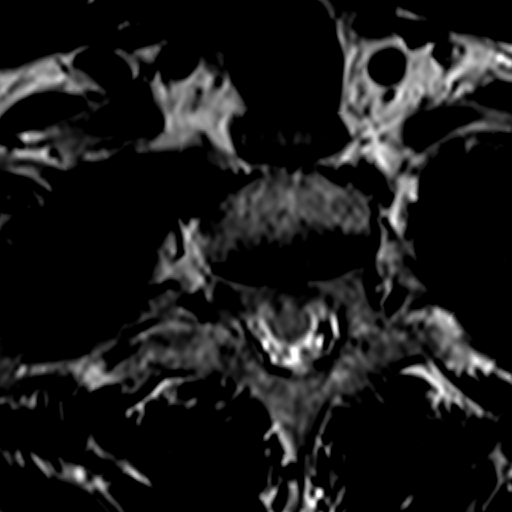
[im 5/36]
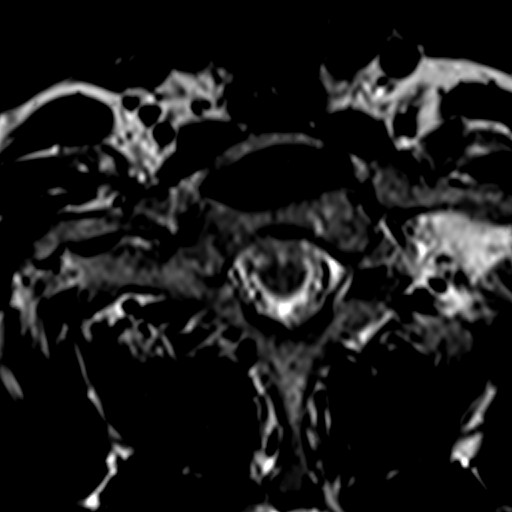
[im 9/36]
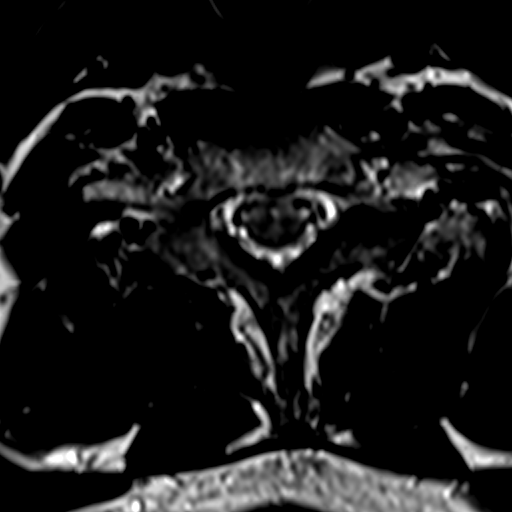
[im 14/36]
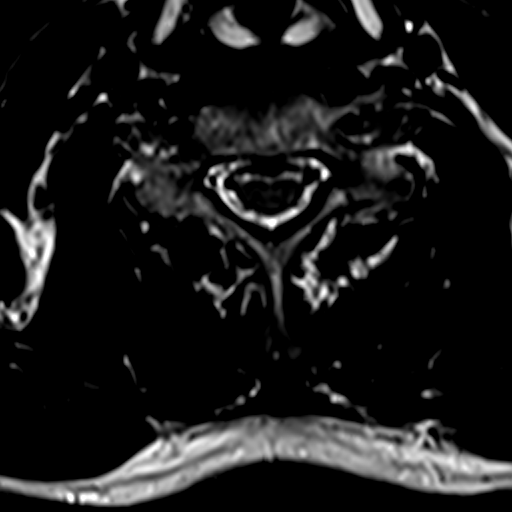
[im 18/36]
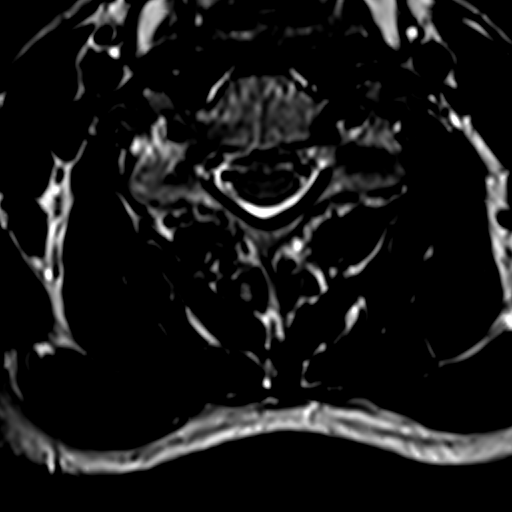
[im 22/36]
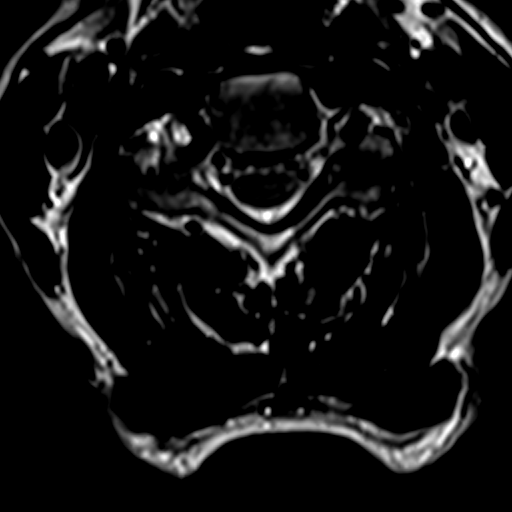
[im 27/36]
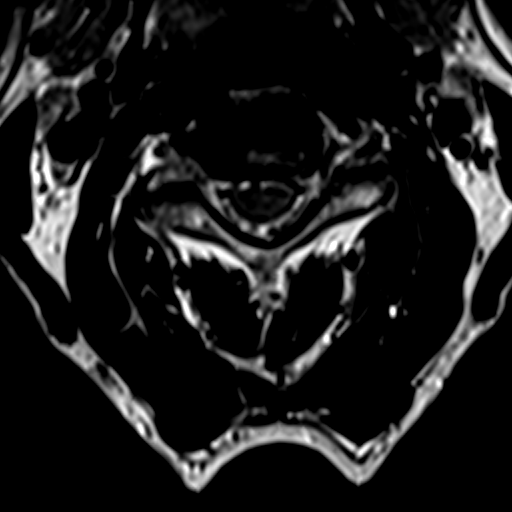
[im 31/36]
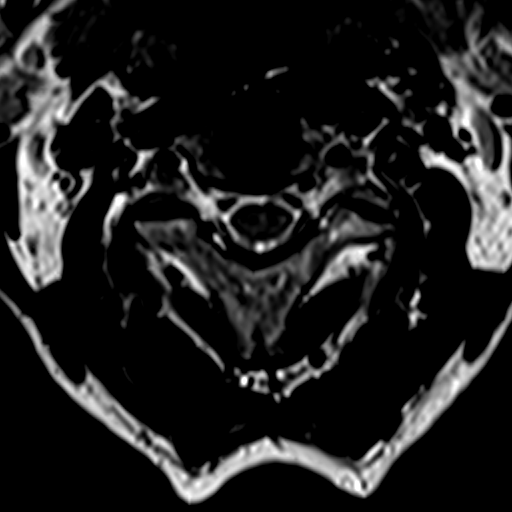

[Series 8: T1 · axial · 3.0mm · 0.19mm/px · z∈[-169,-84]mm · 3 of 36 slices shown (1 of 2)]
[im 5/36]
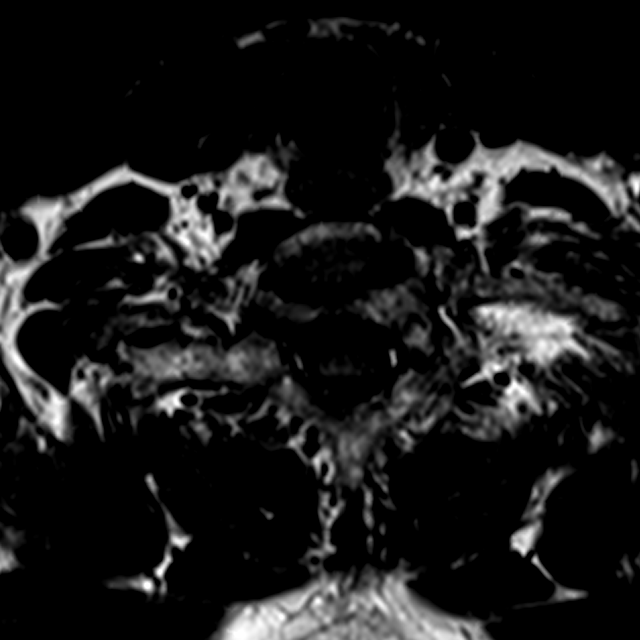
[im 18/36]
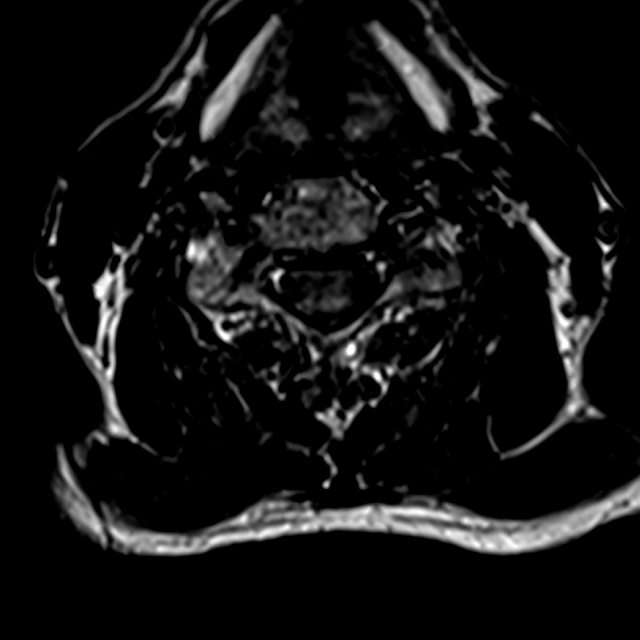
[im 31/36]
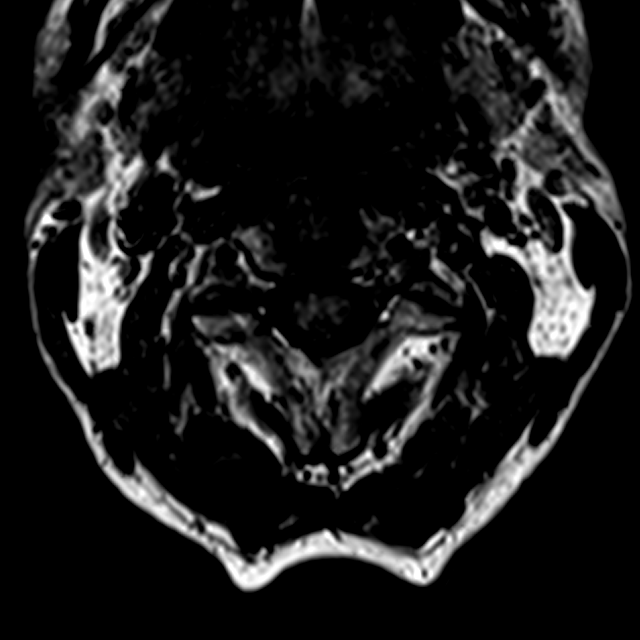

[Series 9: T2 · sagittal · 3.0mm · 0.42mm/px · 3 of 15 slices shown (2 of 2)]
[im 1/15]
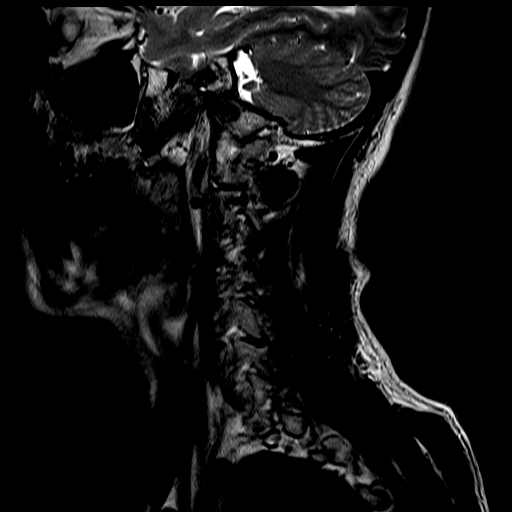
[im 10/15]
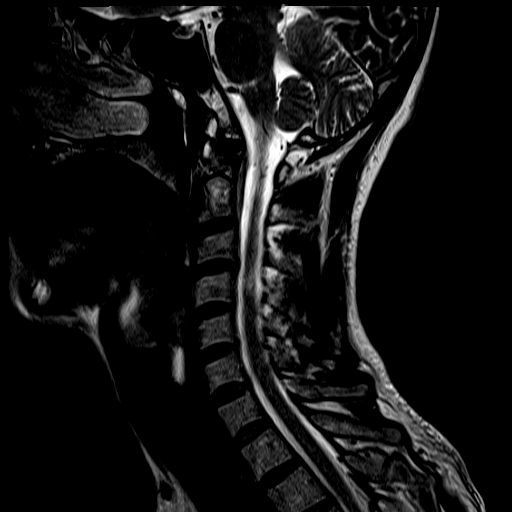
[im 15/15]
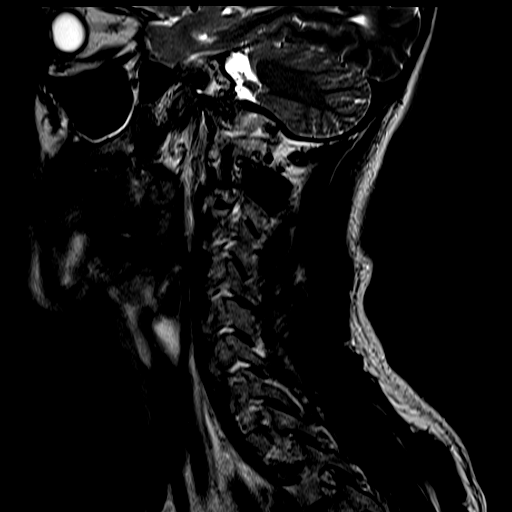

[Series 11: T1 · axial · 3.0mm · 0.19mm/px · z∈[-168,-89]mm · 3 of 34 slices shown (2 of 2)]
[im 5/34]
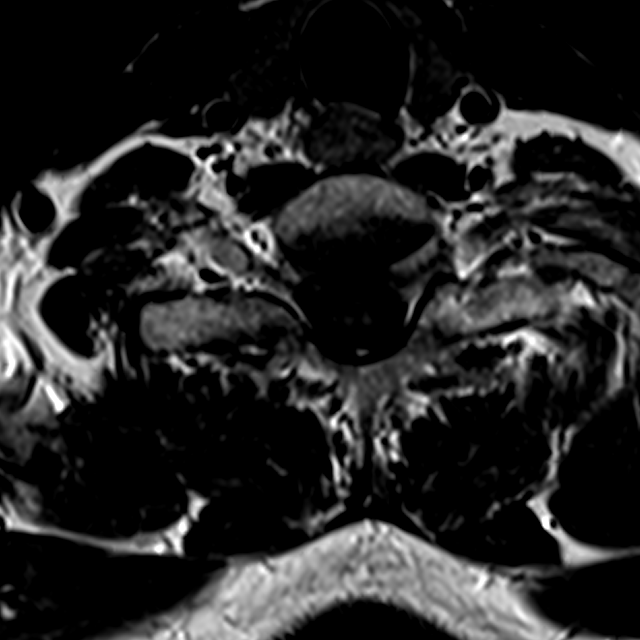
[im 19/34]
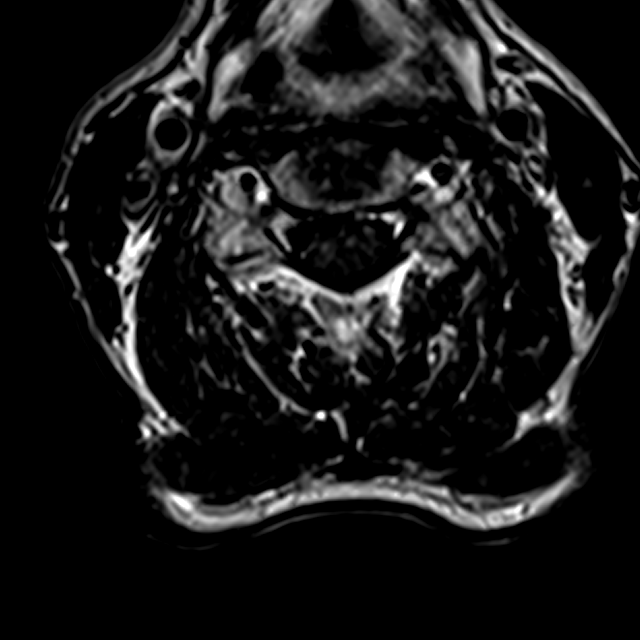
[im 29/34]
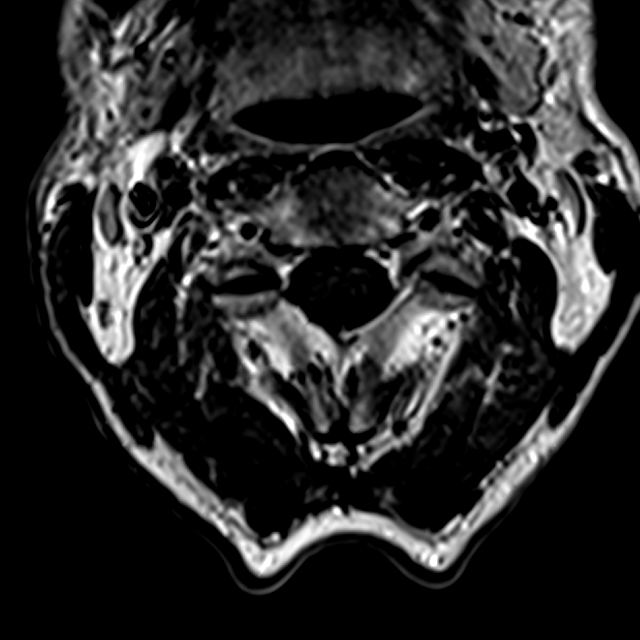

[17 of 48 positions shown; findings below may reference images not displayed]

FINDINGS: Alignment: Normal

Vertebrae: Negative for fracture or mass. Normal enhancement of the
spine

Cord: Hyperintense cord lesion laterally on the left at the C4
level. This does not enhance. No other cord lesions.

Posterior Fossa, vertebral arteries, paraspinal tissues: Negative

Disc levels:

Mild degenerative changes. Mild disc degeneration and mild uncinate
spurring C3-4 without stenosis. Small central disc protrusion at
C5-6. No significant stenosis in the cervical spine.
IMPRESSION: Nonenhancing solitary lesion in the cord on the left at C4. No other
cord lesions

Mild cervical spine degenerative change.

## 2019-04-24 ENCOUNTER — Other Ambulatory Visit (INDEPENDENT_AMBULATORY_CARE_PROVIDER_SITE_OTHER): Payer: Self-pay | Admitting: Internal Medicine

## 2019-05-12 ENCOUNTER — Ambulatory Visit (INDEPENDENT_AMBULATORY_CARE_PROVIDER_SITE_OTHER): Payer: Medicare Other | Admitting: Internal Medicine

## 2019-05-12 ENCOUNTER — Other Ambulatory Visit: Payer: Self-pay

## 2019-05-12 ENCOUNTER — Encounter (INDEPENDENT_AMBULATORY_CARE_PROVIDER_SITE_OTHER): Payer: Self-pay | Admitting: Internal Medicine

## 2019-05-12 VITALS — BP 130/80 | HR 72 | Ht 66.0 in | Wt 122.0 lb

## 2019-05-12 DIAGNOSIS — G35 Multiple sclerosis: Secondary | ICD-10-CM

## 2019-05-12 DIAGNOSIS — J449 Chronic obstructive pulmonary disease, unspecified: Secondary | ICD-10-CM

## 2019-05-12 DIAGNOSIS — F419 Anxiety disorder, unspecified: Secondary | ICD-10-CM | POA: Diagnosis not present

## 2019-05-12 DIAGNOSIS — Z1211 Encounter for screening for malignant neoplasm of colon: Secondary | ICD-10-CM

## 2019-05-12 DIAGNOSIS — E559 Vitamin D deficiency, unspecified: Secondary | ICD-10-CM | POA: Diagnosis not present

## 2019-05-12 DIAGNOSIS — M79641 Pain in right hand: Secondary | ICD-10-CM

## 2019-05-12 HISTORY — DX: Anxiety disorder, unspecified: F41.9

## 2019-05-12 HISTORY — DX: Chronic obstructive pulmonary disease, unspecified: J44.9

## 2019-05-12 NOTE — Progress Notes (Signed)
Wellness Office Visit  Subjective:  Patient ID: Sean Kemp, male    DOB: [birth year]  Age: 53 y.o. MRN: [id number]  CC: This man comes in for follow-up of vitamin D deficiency, anxiety, COPD, multiple sclerosis. HPI  On the last visit, I had recommended increase vitamin D3 to 10,000 units daily which he has done. He is also complaining of right hand pain today and wonders whether he could go back and see orthopedic surgeon. He continues to take inhaler for his COPD and this appears to be stable. Past Medical History:  Diagnosis Date  . Abscess    on brain  . Anxiety 05/12/2019  . Chronic back pain   . COPD (chronic obstructive pulmonary disease) ([place]) 05/12/2019  . Drug abuse ([place])   . Kidney stones   . Multiple sclerosis ([place])   . Sciatica       Family History  Problem Relation Age of Onset  . Valvular heart disease Mother   . 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     Current Meds  Medication Sig  . albuterol (VENTOLIN HFA) 108 (90 Base) MCG/ACT inhaler Inhale 2 puffs into the lungs every 6 (six) hours as needed for wheezing or shortness of breath.  Jearl Klinefelter ELLIPTA 62.5-25 MCG/INH AEPB Inhale 1 puff into the lungs daily.  . Cholecalciferol (VITAMIN D-3) 125 MCG (5000 UT) TABS Take 10,000 Units by mouth daily.  Marland Kitchen gabapentin (NEURONTIN) 300 MG capsule Take 1 capsule (300 mg total) by mouth 3 (three) times daily.  . mirtazapine (REMERON) 7.5 MG tablet TAKE ONE TABLET BY MOUTH AT BEDTIME.  . vitamin B-12 (CYANOCOBALAMIN) 500 MCG tablet Take 500 mcg by mouth daily.       Objective:   Today's Vitals: BP 130/80   Pulse 72   Ht 5\' 6"  (1.676 m)   Wt 122 lb (55.3 kg)   BMI 19.69 kg/m  Vitals with BMI 05/12/2019 02/13/2019 02/13/2019  Height 5\' 6"  - 5\' 6"   Weight 122 lbs - 120 lbs  BMI 0000000 - [id number]  Systolic [id number]   Diastolic 80 94 [id number]  Pulse 72 62 70  Some encounter information is confidential and restricted. Go to Review Flowsheets activity to see all data.     Physical Exam   He looks systemically well.  He has gained about 5 pounds in weight since the last time he was seen in the office.  Examination of his right hand does not show any major abnormalities.  I wonder if he has early Dupuytren's contracture but I am not sure.    Assessment   1. Vitamin D deficiency disease   2. Multiple sclerosis ([place])   3. Chronic obstructive pulmonary disease, unspecified COPD type ([place])   4. Anxiety   5. Colon cancer screening   6. Hand pain, right       Tests ordered Orders Placed This Encounter  Procedures  . COMPLETE METABOLIC PANEL WITH GFR  . VITAMIN D 25 Hydroxy (Vit-D Deficiency, Fractures)  . Ambulatory referral to Gastroenterology  . Ambulatory referral to Orthopedic Surgery     Plan: 1. He will continue with all medications for his chronic conditions above. 2.  Blood work is ordered as above. 3.  I will refer him to orthopedic surgeon for his right hand pain.  He has seen Dr. Aline Brochure previously. 4.  I will also send him for screening colonoscopy. 5.  Further recommendations will depend on the results and he will follow-up with Sarah in about 3 months time.     Doree Albee, MD

## 2019-05-13 ENCOUNTER — Encounter (INDEPENDENT_AMBULATORY_CARE_PROVIDER_SITE_OTHER): Payer: Self-pay | Admitting: Internal Medicine

## 2019-05-13 LAB — COMPLETE METABOLIC PANEL WITH GFR
AG Ratio: 1.7 (calc) (ref 1.0–2.5)
ALT: 19 U/L (ref 9–46)
AST: 18 U/L (ref 10–35)
Albumin: 4.3 g/dL (ref 3.6–5.1)
Alkaline phosphatase (APISO): 81 U/L (ref 35–144)
BUN: 14 mg/dL (ref 7–25)
CO2: 29 mmol/L (ref 20–32)
Calcium: 9.8 mg/dL (ref 8.6–10.3)
Chloride: 104 mmol/L (ref 98–110)
Creat: 1.1 mg/dL (ref 0.70–1.33)
GFR, Est African American: 88 mL/min/{1.73_m2} (ref >=60)
GFR, Est Non African American: 76 mL/min/{1.73_m2} (ref >=60)
Globulin: 2.5 g/dL (calc) (ref 1.9–3.7)
Glucose, Bld: 100 mg/dL — ABNORMAL HIGH (ref 65–99)
Potassium: 4.7 mmol/L (ref 3.5–5.3)
Sodium: 141 mmol/L (ref 135–146)
Total Bilirubin: 0.3 mg/dL (ref 0.2–1.2)
Total Protein: 6.8 g/dL (ref 6.1–8.1)

## 2019-05-13 LAB — VITAMIN D 25 HYDROXY (VIT D DEFICIENCY, FRACTURES): Vit D, 25-Hydroxy: 34 ng/mL (ref 30–100)

## 2019-05-16 ENCOUNTER — Encounter: Payer: Self-pay | Admitting: Internal Medicine

## 2019-06-20 ENCOUNTER — Ambulatory Visit: Payer: Medicare Other | Admitting: Orthopedic Surgery

## 2019-07-01 ENCOUNTER — Encounter: Payer: Self-pay | Admitting: Internal Medicine

## 2019-07-01 ENCOUNTER — Telehealth: Payer: Self-pay | Admitting: Internal Medicine

## 2019-07-01 ENCOUNTER — Ambulatory Visit: Payer: Medicare Other | Admitting: Nurse Practitioner

## 2019-07-01 NOTE — Telephone Encounter (Signed)
PATIENT WAS A NO SHOW AND LETTER SENT  °

## 2019-07-01 NOTE — Progress Notes (Deleted)
Primary Care Physician:  Doree Albee, MD Primary Gastroenterologist:  Dr. Gala Romney  No chief complaint on file.   HPI:   Sean Kemp is a 53 y.o. male who presents to schedule colonoscopy.  Nurse/phone triage was deferred office visit due to medications and history of substance abuse.  Reviewed information provided with referral including ***.  No history of colonoscopy found in our system.  Today he states   Past Medical History:  Diagnosis Date  . Abscess    on brain  . Anxiety 05/12/2019  . Chronic back pain   . COPD (chronic obstructive pulmonary disease) ([place]) 05/12/2019  . Drug abuse ([place])   . Kidney stones   . Multiple sclerosis ([place])   . Sciatica     Past Surgical History:  Procedure Laterality Date  . BRAIN SURGERY    . WRIST SURGERY      Current Outpatient Medications  Medication Sig Dispense Refill  . albuterol (VENTOLIN HFA) 108 (90 Base) MCG/ACT inhaler Inhale 2 puffs into the lungs every 6 (six) hours as needed for wheezing or shortness of breath.    Jearl Klinefelter ELLIPTA 62.5-25 MCG/INH AEPB Inhale 1 puff into the lungs daily.    . Cholecalciferol (VITAMIN D-3) 125 MCG (5000 UT) TABS Take 10,000 Units by mouth daily.    . Cholecalciferol (VITAMIN D3) 2000 units TABS Take 2,000 Units by mouth daily.    Marland Kitchen gabapentin (NEURONTIN) 300 MG capsule Take 1 capsule (300 mg total) by mouth 3 (three) times daily. 90 capsule 0  . hydrOXYzine (ATARAX/VISTARIL) 25 MG tablet Take 1 tablet (25 mg total) by mouth every 6 (six) hours as needed for anxiety. (Patient not taking: Reported on 04/04/2018) 30 tablet 0  . ibuprofen (ADVIL) 800 MG tablet Take 1 tablet (800 mg total) by mouth 3 (three) times daily. 21 tablet 0  . mirtazapine (REMERON) 7.5 MG tablet TAKE ONE TABLET BY MOUTH AT BEDTIME. 30 tablet 2  . silver sulfADIAZINE (SILVADENE) 1 % cream Apply 1 application topically daily. 50 g 0  . traZODone (DESYREL) 100 MG tablet Take 1 tablet (100 mg total) by mouth at  bedtime as needed for sleep. (Patient not taking: Reported on 04/04/2018) 30 tablet 0  . vitamin B-12 (CYANOCOBALAMIN) 500 MCG tablet Take 500 mcg by mouth daily.     No current facility-administered medications for this visit.     Allergies as of 07/01/2019 - Review Complete 05/12/2019  Allergen Reaction Noted  . Ultram [tramadol hcl] Hives 11/16/2010  . Valium [diazepam] Other (See Comments) 04/27/2015    Family History  Problem Relation Age of Onset  . Valvular heart disease Mother   . Osteoporosis Mother     Social History   Socioeconomic History  . Marital status: Single    Spouse name: Not on file  . Number of children: Not on file  . Years of education: Not on file  . Highest education level: Not on file  Occupational History  . Not on file  Social Needs  . Financial resource strain: Not on file  . Food insecurity    Worry: Not on file    Inability: Not on file  . Transportation needs    Medical: Not on file    Non-medical: Not on file  Tobacco Use  . Smoking status: Current Every Day Smoker    Packs/day: 0.50    Types: Cigarettes  . Smokeless tobacco: Never Used  . Tobacco comment: declined education  Substance and  Sexual Activity  . Alcohol use: No  . Drug use: Not Currently    Types: Cocaine    Comment: adderall, pain pills, and gabapentin   . Sexual activity: Yes    Birth control/protection: Condom  Lifestyle  . Physical activity    Days per week: Not on file    Minutes per session: Not on file  . Stress: Not on file  Relationships  . Social Herbalist on phone: Not on file    Gets together: Not on file    Attends religious service: Not on file    Active member of club or organization: Not on file    Attends meetings of clubs or organizations: Not on file    Relationship status: Not on file  . Intimate partner violence    Fear of current or ex partner: Not on file    Emotionally abused: Not on file    Physically abused: Not on file     Forced sexual activity: Not on file  Other Topics Concern  . 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    Review of Systems: General: Negative for anorexia, weight loss, fever, chills, fatigue, weakness. Eyes: Negative for vision changes.  ENT: Negative for hoarseness, difficulty swallowing , nasal congestion. CV: Negative for chest pain, angina, palpitations, dyspnea on exertion, peripheral edema.  Respiratory: Negative for dyspnea at rest, dyspnea on exertion, cough, sputum, wheezing.  GI: See history of present illness. GU:  Negative for dysuria, hematuria, urinary incontinence, urinary frequency, nocturnal urination.  MS: Negative for joint pain, low back pain.  Derm: Negative for rash or itching.  Neuro: Negative for weakness, abnormal sensation, seizure, frequent headaches, memory loss, confusion.  Psych: Negative for anxiety, depression, suicidal ideation, hallucinations.  Endo: Negative for unusual weight change.  Heme: Negative for bruising or bleeding. Allergy: Negative for rash or hives.    Physical Exam: There were no vitals taken for this visit. General:   Alert and oriented. Pleasant and cooperative. Well-nourished and well-developed.  Head:  Normocephalic and atraumatic. Eyes:  Without icterus, sclera clear and conjunctiva pink.  Ears:  Normal auditory acuity. Mouth:  No deformity or lesions, oral mucosa pink.  Throat/Neck:  Supple, without mass or thyromegaly. Cardiovascular:  S1, S2 present without murmurs appreciated. Normal pulses noted. Extremities without clubbing or edema. Respiratory:  Clear to auscultation bilaterally. No wheezes, rales, or rhonchi. No distress.  Gastrointestinal:  +BS, soft, non-tender and non-distended. No HSM noted. No guarding or rebound. No masses appreciated.  Rectal:  Deferred  Musculoskalatal:  Symmetrical without gross  deformities. Normal posture. Skin:  Intact without significant lesions or rashes. Neurologic:  Alert and oriented x4;  grossly normal neurologically. Psych:  Alert and cooperative. Normal mood and affect. Heme/Lymph/Immune: No significant cervical adenopathy. No excessive bruising noted.    07/01/2019 7:49 AM   Disclaimer: This note was dictated with voice recognition software. Similar sounding words can inadvertently be transcribed and may not be corrected upon review.

## 2019-07-02 ENCOUNTER — Ambulatory Visit: Payer: Medicare Other | Admitting: Orthopedic Surgery

## 2019-07-23 ENCOUNTER — Ambulatory Visit: Payer: Medicare Other | Admitting: Orthopedic Surgery

## 2019-07-23 ENCOUNTER — Encounter: Payer: Self-pay | Admitting: Orthopedic Surgery

## 2019-08-21 ENCOUNTER — Ambulatory Visit (INDEPENDENT_AMBULATORY_CARE_PROVIDER_SITE_OTHER): Payer: Medicare Other | Admitting: Nurse Practitioner

## 2019-08-21 DIAGNOSIS — E559 Vitamin D deficiency, unspecified: Secondary | ICD-10-CM

## 2019-08-21 DIAGNOSIS — J449 Chronic obstructive pulmonary disease, unspecified: Secondary | ICD-10-CM

## 2019-08-21 DIAGNOSIS — M79641 Pain in right hand: Secondary | ICD-10-CM

## 2019-08-21 DIAGNOSIS — F419 Anxiety disorder, unspecified: Secondary | ICD-10-CM

## 2019-08-21 MED ORDER — ANORO ELLIPTA 62.5-25 MCG/INH IN AEPB: 1.0000 | INHALATION_SPRAY | Freq: Every day | RESPIRATORY_TRACT | 3 refills | Status: DC

## 2019-08-21 MED ORDER — HYDROXYZINE HCL 25 MG PO TABS: 25.0000 mg | ORAL_TABLET | Freq: Three times a day (TID) | ORAL | 0 refills | Status: DC | PRN

## 2019-08-21 MED ORDER — ALBUTEROL SULFATE HFA 108 (90 BASE) MCG/ACT IN AERS: 2.0000 | INHALATION_SPRAY | Freq: Four times a day (QID) | RESPIRATORY_TRACT | 2 refills | Status: DC | PRN

## 2019-08-21 MED ORDER — HYDROXYZINE HCL 25 MG PO TABS: 25.0000 mg | ORAL_TABLET | Freq: Three times a day (TID) | ORAL | 3 refills | Status: DC | PRN

## 2019-08-21 MED ORDER — MIRTAZAPINE 7.5 MG PO TABS: 7.5000 mg | ORAL_TABLET | Freq: Every day | ORAL | 1 refills | Status: DC

## 2019-08-21 NOTE — Assessment & Plan Note (Signed)
At this time he does not want to be rereferred to the orthopedic surgeon, and is happy with management of the pain in his right hand using his copper band.  I encouraged him to let me know if he changes his mind and would like to be referred to orthopedic surgeon.  He tells me he understands.

## 2019-08-21 NOTE — Progress Notes (Signed)
Due to national recommendations of social distancing related to the [place] pandemic, an audio/visual tele-health visit was felt to be the most appropriate encounter type for this patient today. I connected with  Sean Kemp on 08/21/19 utilizing audio-only technology and verified that I am speaking with the correct person using two identifiers. The patient was located at their home, and I was located at the office of [facility] during the encounter.  The patient stated he did not have access to technology that would allow him to do a video visit, thus audio only visit was completed.  I discussed the limitations of evaluation and management by telemedicine. The patient expressed understanding and agreed to proceed.      Subjective:  Patient ID: Sean Kemp, male    DOB: [birth year]  Age: 54 y.o. MRN: [id number]  CC:  Chief Complaint  Patient presents with  . Follow-up      HPI  This patient presents for virtual visit for follow-up of chronic conditions.  Vitamin D deficiency: Last serum level was collected in September 2020 and was 34.  At that time he was supposed to be taking 10,000 international units of vitamin D3 daily.  He tells me that he has been taking this dose and continues to take this dose daily.  Hand pain: During chart review and noted that she has had some pain in his right hand, and was referred to orthopedic surgeon for further evaluation.  I note that he never did go see the orthopedic surgeon even when I asked him why not, he tells me he had financial barriers and was not able to afford the co-pay so he did not go to the appointment.  He tells me he has been using a copper band which has actually been improving his symptoms, and he is happy with this for now.  COPD: He has a history of COPD and has been prescribed albuterol and Anoro inhalers.  He tells me his breathing has been stable and he is out of both inhalers.  He would like a  refill.  Depression/anxiety: He has a history of depression anxiety and takes hydroxyzine as needed as well as mirtazapine at night as needed.  He tells me he has been feeling generally well, but does need a refill on these medications as well.  Past Medical History:  Diagnosis Date  . Abscess    on brain  . Anxiety 05/12/2019  . Chronic back pain   . COPD (chronic obstructive pulmonary disease) ([place]) 05/12/2019  . Drug abuse ([place])   . Kidney stones   . Multiple sclerosis ([place])   . Sciatica       Family History  Problem Relation Age of Onset  . Valvular heart disease Mother   . 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   Social History   Tobacco Use  . Smoking status: Current Every Day Smoker    Packs/day: 0.50    Types: Cigarettes  . Smokeless tobacco: Never Used  . Tobacco comment: declined education  Substance Use Topics  . Alcohol use: No     Current Meds  Medication Sig  . albuterol (VENTOLIN HFA) 108 (90 Base) MCG/ACT inhaler Inhale 2 puffs into the lungs every 6 (six) hours as needed for wheezing or shortness of breath.  Marland Kitchen amoxicillin (AMOXIL) 500 MG tablet Take  500 mg by mouth every 6 (six) hours.  Jearl Klinefelter ELLIPTA 62.5-25 MCG/INH AEPB Inhale 1 puff into the lungs daily.  . Cholecalciferol (VITAMIN D-3) 125 MCG (5000 UT) TABS Take 10,000 Units by mouth daily.  Marland Kitchen gabapentin (NEURONTIN) 300 MG capsule Take 1 capsule (300 mg total) by mouth 3 (three) times daily.  . hydrOXYzine (ATARAX/VISTARIL) 25 MG tablet Take 1 tablet (25 mg total) by mouth every 8 (eight) hours as needed for anxiety.  Marland Kitchen ibuprofen (ADVIL) 800 MG tablet Take 1 tablet (800 mg total) by mouth 3 (three) times daily.  . vitamin B-12 (CYANOCOBALAMIN) 500 MCG tablet Take 500 mcg by mouth daily as needed.   . [DISCONTINUED] albuterol (VENTOLIN HFA) 108 (90 Base)  MCG/ACT inhaler Inhale 2 puffs into the lungs every 6 (six) hours as needed for wheezing or shortness of breath.  . [DISCONTINUED] ANORO ELLIPTA 62.5-25 MCG/INH AEPB Inhale 1 puff into the lungs daily.  . [DISCONTINUED] hydrOXYzine (ATARAX/VISTARIL) 25 MG tablet Take 1 tablet (25 mg total) by mouth every 6 (six) hours as needed for anxiety.  . [DISCONTINUED] hydrOXYzine (ATARAX/VISTARIL) 25 MG tablet Take 1 tablet (25 mg total) by mouth every 8 (eight) hours as needed for anxiety.    ROS:  Review of Systems  Constitutional: Negative.   Respiratory: Negative.   Cardiovascular: Negative.   Neurological: Negative.      Objective:   Today's Vitals: There were no vitals taken for this visit.  Vital signs are not collected today because patient stated he did not have access to equipment that would allow him to collect his vital signs. Vitals with BMI 05/12/2019 02/13/2019 02/13/2019  Height 5\' 6"  - 5\' 6"   Weight 122 lbs - 120 lbs  BMI 0000000 - 123XX123  Systolic [id number]  Diastolic 80 94 [id number]  Pulse 72 62 70  Some encounter information is confidential and restricted. Go to Review Flowsheets activity to see all data.     Physical Exam Comprehensive physical exam not completed today as office visit was conducted remotely.  Patient did sound well over the phone, he was alert and oriented, he answered questions appropriately and appeared to have appropriate judgment.      Assessment   1. Chronic obstructive pulmonary disease, unspecified COPD type ([place])   2. Anxiety       Tests ordered No orders of the defined types were placed in this encounter.    Plan: Please see assessment and plan per problem list below.   Meds ordered this encounter  Medications  . albuterol (VENTOLIN HFA) 108 (90 Base) MCG/ACT inhaler    Sig: Inhale 2 puffs into the lungs every 6 (six) hours as needed for wheezing or shortness of breath.    Dispense:  8 g    Refill:  2    Order Specific Question:    Supervising Provider    Answer:   Anastasio Champion, NIMISH C [id number]  . ANORO ELLIPTA 62.5-25 MCG/INH AEPB    Sig: Inhale 1 puff into the lungs daily.    Dispense:  1 each    Refill:  3    Order Specific Question:   Supervising Provider    Answer:   Hurshel Party C [id number]  . DISCONTD: hydrOXYzine (ATARAX/VISTARIL) 25 MG tablet    Sig: Take 1 tablet (25 mg total) by mouth every 8 (eight) hours as needed for anxiety.    Dispense:  30 tablet    Refill:  0    Order Specific Question:  Supervising Provider    Answer:   Doree Albee [id number]  . mirtazapine (REMERON) 7.5 MG tablet    Sig: Take 1 tablet (7.5 mg total) by mouth at bedtime.    Dispense:  30 tablet    Refill:  1    Order Specific Question:   Supervising Provider    Answer:   Hurshel Party C [id number]  . hydrOXYzine (ATARAX/VISTARIL) 25 MG tablet    Sig: Take 1 tablet (25 mg total) by mouth every 8 (eight) hours as needed for anxiety.    Dispense:  30 tablet    Refill:  3    Order Specific Question:   Supervising Provider    Answer:   Doree Albee [id number]    Patient to follow-up in 3 months.  Ailene Ards, NP

## 2019-08-21 NOTE — Assessment & Plan Note (Signed)
I have refilled his hydroxyzine that he can take as needed for anxiety as well as his mirtazapine which he takes by mouth in the evening.

## 2019-08-21 NOTE — Assessment & Plan Note (Signed)
He will continue on his vitamin D supplement at this time.  He will return in about 3 months for his annual physical exam at which time blood work will be collected.

## 2019-08-21 NOTE — Addendum Note (Signed)
Addended by: Ailene Ards on: 08/21/2019 03:45 PM   Modules accepted: Orders

## 2019-08-21 NOTE — Patient Instructions (Signed)
Thank you for choosing [facility] as your medical provider! If you have any questions or concerns regarding your health care, please do not hesitate to call our office.  Continue all medications as prescribed.  Please let us know if you would like referral to the orthopedic surgeon for further evaluation of your hand pain.  Call us if you have any questions or concerns prior to your next upcoming appointment.  Please follow-up as scheduled in 3 months. We look forward to seeing you again soon!   At [facility] we value your feedback. You may receive a survey about your visit today. Please share your experience as we strive to create trusting relationships with our patients to provide genuine, compassionate, quality care.  We appreciate your understanding and patience as we review any laboratory studies, imaging, and other diagnostic tests that are ordered as we care for you. We do our best to address any and all results in a timely manner. If you do not hear about test results within 1 week, please do not hesitate to contact us. If we referred you to a specialist during your visit or ordered imaging testing, contact the office if you have not been contacted to be scheduled within 1 weeks.  We also encourage the use of MyChart, which contains your medical information for your review as well. If you are not enrolled in this feature, an access code is on this after visit summary for your convenience. Thank you for allowing [place] to be involved in your care.

## 2019-08-21 NOTE — Assessment & Plan Note (Signed)
He will continue his current medications.  I have refilled his albuterol as well as his Anoro.  I reemphasized the importance of taking his Anoro inhaler daily, and using albuterol as needed.  He tells me he understands.

## 2019-11-17 ENCOUNTER — Ambulatory Visit (INDEPENDENT_AMBULATORY_CARE_PROVIDER_SITE_OTHER): Payer: Medicare Other | Admitting: Internal Medicine

## 2020-05-07 ENCOUNTER — Other Ambulatory Visit (INDEPENDENT_AMBULATORY_CARE_PROVIDER_SITE_OTHER): Payer: Self-pay | Admitting: Nurse Practitioner

## 2020-05-07 DIAGNOSIS — F419 Anxiety disorder, unspecified: Secondary | ICD-10-CM

## 2020-05-07 DIAGNOSIS — J449 Chronic obstructive pulmonary disease, unspecified: Secondary | ICD-10-CM

## 2020-05-17 ENCOUNTER — Other Ambulatory Visit (INDEPENDENT_AMBULATORY_CARE_PROVIDER_SITE_OTHER): Payer: Self-pay | Admitting: Nurse Practitioner

## 2020-05-17 DIAGNOSIS — Z79899 Other long term (current) drug therapy: Secondary | ICD-10-CM | POA: Diagnosis not present

## 2020-05-17 DIAGNOSIS — R471 Dysarthria and anarthria: Secondary | ICD-10-CM | POA: Diagnosis not present

## 2020-05-17 DIAGNOSIS — G35 Multiple sclerosis: Secondary | ICD-10-CM | POA: Diagnosis not present

## 2020-05-17 DIAGNOSIS — F419 Anxiety disorder, unspecified: Secondary | ICD-10-CM

## 2020-05-17 DIAGNOSIS — M792 Neuralgia and neuritis, unspecified: Secondary | ICD-10-CM | POA: Diagnosis not present

## 2020-05-17 DIAGNOSIS — G459 Transient cerebral ischemic attack, unspecified: Secondary | ICD-10-CM | POA: Diagnosis not present

## 2020-05-17 DIAGNOSIS — J449 Chronic obstructive pulmonary disease, unspecified: Secondary | ICD-10-CM

## 2020-05-24 ENCOUNTER — Encounter (INDEPENDENT_AMBULATORY_CARE_PROVIDER_SITE_OTHER): Payer: Self-pay | Admitting: Nurse Practitioner

## 2020-05-24 ENCOUNTER — Ambulatory Visit (INDEPENDENT_AMBULATORY_CARE_PROVIDER_SITE_OTHER): Payer: Medicare Other | Admitting: Nurse Practitioner

## 2020-05-24 ENCOUNTER — Other Ambulatory Visit (HOSPITAL_COMMUNITY): Payer: Self-pay | Admitting: Neurology

## 2020-05-24 ENCOUNTER — Other Ambulatory Visit: Payer: Self-pay

## 2020-05-24 ENCOUNTER — Other Ambulatory Visit: Payer: Self-pay | Admitting: Neurology

## 2020-05-24 VITALS — BP 136/86 | HR 67 | Temp 97.9°F | Resp 12 | Ht 67.0 in | Wt 119.4 lb

## 2020-05-24 DIAGNOSIS — Z139 Encounter for screening, unspecified: Secondary | ICD-10-CM

## 2020-05-24 DIAGNOSIS — G459 Transient cerebral ischemic attack, unspecified: Secondary | ICD-10-CM

## 2020-05-24 DIAGNOSIS — Z131 Encounter for screening for diabetes mellitus: Secondary | ICD-10-CM

## 2020-05-24 DIAGNOSIS — J449 Chronic obstructive pulmonary disease, unspecified: Secondary | ICD-10-CM | POA: Diagnosis not present

## 2020-05-24 DIAGNOSIS — F419 Anxiety disorder, unspecified: Secondary | ICD-10-CM

## 2020-05-24 DIAGNOSIS — E781 Pure hyperglyceridemia: Secondary | ICD-10-CM

## 2020-05-24 DIAGNOSIS — Z13220 Encounter for screening for lipoid disorders: Secondary | ICD-10-CM | POA: Diagnosis not present

## 2020-05-24 DIAGNOSIS — R471 Dysarthria and anarthria: Secondary | ICD-10-CM

## 2020-05-24 DIAGNOSIS — E559 Vitamin D deficiency, unspecified: Secondary | ICD-10-CM

## 2020-05-24 LAB — COMPLETE METABOLIC PANEL WITH GFR
AG Ratio: 1.6 (calc) (ref 1.0–2.5)
ALT: 10 U/L (ref 9–46)
AST: 14 U/L (ref 10–35)
Albumin: 3.8 g/dL (ref 3.6–5.1)
Alkaline phosphatase (APISO): 66 U/L (ref 35–144)
BUN: 10 mg/dL (ref 7–25)
CO2: 29 mmol/L (ref 20–32)
Calcium: 8.9 mg/dL (ref 8.6–10.3)
Chloride: 108 mmol/L (ref 98–110)
Creat: 0.87 mg/dL (ref 0.70–1.33)
GFR, Est African American: 113 mL/min/{1.73_m2} (ref >=60)
GFR, Est Non African American: 98 mL/min/{1.73_m2} (ref >=60)
Globulin: 2.4 g/dL (calc) (ref 1.9–3.7)
Glucose, Bld: 125 mg/dL — ABNORMAL HIGH (ref 65–99)
Potassium: 4.3 mmol/L (ref 3.5–5.3)
Sodium: 141 mmol/L (ref 135–146)
Total Bilirubin: 0.2 mg/dL (ref 0.2–1.2)
Total Protein: 6.2 g/dL (ref 6.1–8.1)

## 2020-05-24 LAB — CBC
HCT: 34.9 % — ABNORMAL LOW (ref 38.5–50.0)
Hemoglobin: 11.7 g/dL — ABNORMAL LOW (ref 13.2–17.1)
MCH: 32.8 pg (ref 27.0–33.0)
MCHC: 33.5 g/dL (ref 32.0–36.0)
MCV: 97.8 fL (ref 80.0–100.0)
MPV: 9.5 fL (ref 7.5–12.5)
Platelets: 305 10*3/uL (ref 140–400)
RBC: 3.57 10*6/uL — ABNORMAL LOW (ref 4.20–5.80)
RDW: 12.7 % (ref 11.0–15.0)
WBC: 5.4 10*3/uL (ref 3.8–10.8)

## 2020-05-24 LAB — TSH: TSH: 0.29 mIU/L — ABNORMAL LOW (ref 0.40–4.50)

## 2020-05-24 LAB — VITAMIN D 25 HYDROXY (VIT D DEFICIENCY, FRACTURES): Vit D, 25-Hydroxy: 105 ng/mL — ABNORMAL HIGH (ref 30–100)

## 2020-05-24 LAB — LIPID PANEL
Cholesterol: 135 mg/dL (ref <200)
HDL: 41 mg/dL (ref >=40)
LDL Cholesterol (Calc): 74 mg/dL (calc)
Non-HDL Cholesterol (Calc): 94 mg/dL (calc) (ref <130)
Total CHOL/HDL Ratio: 3.3 (calc) (ref <5.0)
Triglycerides: 113 mg/dL (ref <150)

## 2020-05-24 MED ORDER — ANORO ELLIPTA 62.5-25 MCG/INH IN AEPB: 1.0000 | INHALATION_SPRAY | Freq: Every day | RESPIRATORY_TRACT | 3 refills | Status: AC

## 2020-05-24 MED ORDER — ALBUTEROL SULFATE HFA 108 (90 BASE) MCG/ACT IN AERS: 2.0000 | INHALATION_SPRAY | Freq: Four times a day (QID) | RESPIRATORY_TRACT | 2 refills | Status: AC | PRN

## 2020-05-24 MED ORDER — MIRTAZAPINE 7.5 MG PO TABS: 7.5000 mg | ORAL_TABLET | Freq: Every day | ORAL | 0 refills | Status: DC

## 2020-05-24 NOTE — Progress Notes (Signed)
Subjective:  Patient ID: Sean Kemp, male    DOB: 06-Mar-1966  Age: 54 y.o. MRN: [id number]  CC:  Chief Complaint  Patient presents with  . COPD  . Other    Vitamin D deficiency  . Depression      HPI  This patient arrives today for the above.  COPD: He continues on his Anoro inhaler and albuterol as needed.  He is due for flu shot today.  He is up-to-date with pneumonia vaccine.  He denies a history of allergic reaction to flu vaccine, allergic reaction to eggs, or history of Gilliam Barr syndrome.  He has not had the COVID-19 vaccine series but is considering this.  He feels that his breathing overall is been fairly stable.  Vitamin D deficiency: He continues on his vitamin D3 supplement.  He is due for serum check today.  Depression: He is requesting refill on Remeron, feels that his mood is fairly stable.  Of note, he is overdue for annual physical exam.  Past Medical History:  Diagnosis Date  . Abscess    on brain  . Anxiety 05/12/2019  . Chronic back pain   . COPD (chronic obstructive pulmonary disease) ([place]) 05/12/2019  . Drug abuse ([place])   . Kidney stones   . Multiple sclerosis ([place])   . Sciatica       Family History  Problem Relation Age of Onset  . Valvular heart disease Mother   . 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   Social History   Tobacco Use  . Smoking status: Current Every Day Smoker    Packs/day: 0.50    Types: Cigarettes  . Smokeless tobacco: Never Used  . Tobacco comment: declined education  Substance Use Topics  . Alcohol use: No     Current Meds  Medication Sig  . albuterol (VENTOLIN HFA) 108 (90 Base) MCG/ACT inhaler Inhale 2 puffs into the lungs every 6 (six) hours as needed for wheezing or shortness of breath.  Jearl Klinefelter ELLIPTA 62.5-25 MCG/INH AEPB Inhale 1 puff into the  lungs daily.  . Cholecalciferol (VITAMIN D-3) 125 MCG (5000 UT) TABS Take 10,000 Units by mouth daily.  Marland Kitchen gabapentin (NEURONTIN) 300 MG capsule Take 1 capsule (300 mg total) by mouth 3 (three) times daily.  . hydrOXYzine (ATARAX/VISTARIL) 25 MG tablet Take 1 tablet (25 mg total) by mouth every 8 (eight) hours as needed for anxiety.  Marland Kitchen ibuprofen (ADVIL) 800 MG tablet Take 1 tablet (800 mg total) by mouth 3 (three) times daily.  . vitamin B-12 (CYANOCOBALAMIN) 500 MCG tablet Take 500 mcg by mouth daily as needed.   . [DISCONTINUED] albuterol (VENTOLIN HFA) 108 (90 Base) MCG/ACT inhaler Inhale 2 puffs into the lungs every 6 (six) hours as needed for wheezing or shortness of breath.  . [DISCONTINUED] ANORO ELLIPTA 62.5-25 MCG/INH AEPB Inhale 1 puff into the lungs daily.    ROS:  Review of Systems  Constitutional: Negative.   Eyes: Negative.   Respiratory: Positive for cough (intermittently) and shortness of breath (intermittently).   Cardiovascular: Negative.   Gastrointestinal: Negative.   Musculoskeletal: Positive for falls (secondary to MS - is followed by neurology awaiting MRI).  Neurological: Positive for weakness (secondary to MS).  Psychiatric/Behavioral: Negative.      Objective:   Today's Vitals: BP 136/86   Pulse 67   Temp  97.9 F (36.6 C)   Resp 12   Ht 5' 7"  (1.702 m)   Wt 119 lb 6.4 oz (54.2 kg)   SpO2 95%   BMI 18.70 kg/m  Vitals with BMI 05/24/2020 05/12/2019 02/13/2019  Height 5' 7"  5' 6"  -  Weight 119 lbs 6 oz 122 lbs -  BMI 07.6 22.6 -  Systolic 333 545 625  Diastolic 86 80 94  Pulse 67 72 62  Some encounter information is confidential and restricted. Go to Review Flowsheets activity to see all data.     Physical Exam Vitals reviewed.  Constitutional:      Appearance: Normal appearance.  HENT:     Head: Normocephalic and atraumatic.  Cardiovascular:     Rate and Rhythm: Normal rate and regular rhythm.  Pulmonary:     Effort: Pulmonary effort is  normal.     Breath sounds: Normal breath sounds.  Musculoskeletal:     Cervical back: Neck supple.  Skin:    General: Skin is warm and dry.  Neurological:     Mental Status: He is alert and oriented to person, place, and time.  Psychiatric:        Mood and Affect: Mood normal.        Behavior: Behavior normal.        Thought Content: Thought content normal.        Judgment: Judgment normal.          Assessment and Plan   1. Vitamin D deficiency   2. Chronic obstructive pulmonary disease, unspecified COPD type ([place])   3. Anxiety   4. Screening for lipid disorders   5. Screening for diabetes mellitus   6. Screening for condition      Plan: 1.  Continue on his medication regimen for his vitamin D deficiency.  We will check serum level today. 2.  We will refill his Anoro and albuterol inhaler.  Will administer flu shot today. 3.  We will refill Remeron today. 4.-6.  We will collect blood work today as is been quite sometime since blood work has been drawn him in addition to being drawn in preparation for his upcoming annual physical exam.   Tests ordered Orders Placed This Encounter  Procedures  . CMP with eGFR([facility])  . Vitamin D, 25-hydroxy  . Lipid Panel  . Hemoglobin A1c  . CBC  . TSH      Meds ordered this encounter  Medications  . albuterol (VENTOLIN HFA) 108 (90 Base) MCG/ACT inhaler    Sig: Inhale 2 puffs into the lungs every 6 (six) hours as needed for wheezing or shortness of breath.    Dispense:  18 g    Refill:  2    Order Specific Question:   Supervising Provider    Answer:   Anastasio Champion, NIMISH C [[id number]]  . ANORO ELLIPTA 62.5-25 MCG/INH AEPB    Sig: Inhale 1 puff into the lungs daily.    Dispense:  1 each    Refill:  3    Order Specific Question:   Supervising Provider    Answer:   Hurshel Party C [[id number]]  . mirtazapine (REMERON) 7.5 MG tablet    Sig: Take 1 tablet (7.5 mg total) by mouth at bedtime.    Dispense:  90 tablet    Refill:  0     Order Specific Question:   Supervising Provider    Answer:   Doree Albee [[id number]]    Patient to follow-up in 1 month  for annual physical exam, or sooner as needed.  Ailene Ards, NP

## 2020-05-25 ENCOUNTER — Other Ambulatory Visit (INDEPENDENT_AMBULATORY_CARE_PROVIDER_SITE_OTHER): Payer: Self-pay | Admitting: Nurse Practitioner

## 2020-05-25 ENCOUNTER — Telehealth (INDEPENDENT_AMBULATORY_CARE_PROVIDER_SITE_OTHER): Payer: Self-pay | Admitting: Nurse Practitioner

## 2020-05-25 DIAGNOSIS — R7989 Other specified abnormal findings of blood chemistry: Secondary | ICD-10-CM

## 2020-05-25 DIAGNOSIS — G4700 Insomnia, unspecified: Secondary | ICD-10-CM

## 2020-05-25 NOTE — Telephone Encounter (Signed)
Sean Kemp, please call this patient let him know that I got Megan's message. Based on his response to her questions I would like for him to come in before his next appointment for repeat blood work. I will place those orders today. Thank you.

## 2020-06-03 ENCOUNTER — Ambulatory Visit (HOSPITAL_COMMUNITY): Payer: Medicare Other

## 2020-06-14 ENCOUNTER — Encounter (HOSPITAL_COMMUNITY): Payer: Self-pay

## 2020-06-14 ENCOUNTER — Ambulatory Visit (HOSPITAL_COMMUNITY): Payer: Medicare Other | Attending: Neurology

## 2020-07-06 ENCOUNTER — Encounter (INDEPENDENT_AMBULATORY_CARE_PROVIDER_SITE_OTHER): Payer: Medicare Other | Admitting: Internal Medicine

## 2020-08-03 ENCOUNTER — Encounter (INDEPENDENT_AMBULATORY_CARE_PROVIDER_SITE_OTHER): Payer: Medicare Other | Admitting: Internal Medicine

## 2020-08-19 DIAGNOSIS — R471 Dysarthria and anarthria: Secondary | ICD-10-CM | POA: Diagnosis not present

## 2020-08-19 DIAGNOSIS — Z79899 Other long term (current) drug therapy: Secondary | ICD-10-CM | POA: Diagnosis not present

## 2020-08-19 DIAGNOSIS — I10 Essential (primary) hypertension: Secondary | ICD-10-CM | POA: Diagnosis not present

## 2020-08-19 DIAGNOSIS — G459 Transient cerebral ischemic attack, unspecified: Secondary | ICD-10-CM | POA: Diagnosis not present

## 2020-08-19 DIAGNOSIS — G8921 Chronic pain due to trauma: Secondary | ICD-10-CM | POA: Diagnosis not present

## 2020-08-19 DIAGNOSIS — G35 Multiple sclerosis: Secondary | ICD-10-CM | POA: Diagnosis not present

## 2020-08-19 DIAGNOSIS — M792 Neuralgia and neuritis, unspecified: Secondary | ICD-10-CM | POA: Diagnosis not present

## 2020-08-19 DIAGNOSIS — R202 Paresthesia of skin: Secondary | ICD-10-CM | POA: Diagnosis not present

## 2020-08-25 ENCOUNTER — Other Ambulatory Visit (INDEPENDENT_AMBULATORY_CARE_PROVIDER_SITE_OTHER): Payer: Self-pay | Admitting: Nurse Practitioner

## 2020-08-25 DIAGNOSIS — F419 Anxiety disorder, unspecified: Secondary | ICD-10-CM

## 2020-08-25 DIAGNOSIS — Z139 Encounter for screening, unspecified: Secondary | ICD-10-CM

## 2020-08-25 DIAGNOSIS — Z131 Encounter for screening for diabetes mellitus: Secondary | ICD-10-CM

## 2020-08-25 DIAGNOSIS — E559 Vitamin D deficiency, unspecified: Secondary | ICD-10-CM

## 2020-08-25 DIAGNOSIS — E781 Pure hyperglyceridemia: Secondary | ICD-10-CM

## 2020-08-25 DIAGNOSIS — J449 Chronic obstructive pulmonary disease, unspecified: Secondary | ICD-10-CM

## 2020-09-30 ENCOUNTER — Other Ambulatory Visit (INDEPENDENT_AMBULATORY_CARE_PROVIDER_SITE_OTHER): Payer: Self-pay | Admitting: Internal Medicine

## 2020-09-30 DIAGNOSIS — Z139 Encounter for screening, unspecified: Secondary | ICD-10-CM

## 2020-09-30 DIAGNOSIS — E559 Vitamin D deficiency, unspecified: Secondary | ICD-10-CM

## 2020-09-30 DIAGNOSIS — F419 Anxiety disorder, unspecified: Secondary | ICD-10-CM

## 2020-09-30 DIAGNOSIS — E781 Pure hyperglyceridemia: Secondary | ICD-10-CM

## 2020-09-30 DIAGNOSIS — Z131 Encounter for screening for diabetes mellitus: Secondary | ICD-10-CM

## 2020-09-30 DIAGNOSIS — J449 Chronic obstructive pulmonary disease, unspecified: Secondary | ICD-10-CM

## 2020-10-21 ENCOUNTER — Other Ambulatory Visit: Payer: Self-pay

## 2020-10-21 ENCOUNTER — Encounter (INDEPENDENT_AMBULATORY_CARE_PROVIDER_SITE_OTHER): Payer: Self-pay | Admitting: Internal Medicine

## 2020-10-21 ENCOUNTER — Ambulatory Visit (INDEPENDENT_AMBULATORY_CARE_PROVIDER_SITE_OTHER): Payer: Medicare Other | Admitting: Internal Medicine

## 2020-10-21 VITALS — BP 133/70 | Temp 97.2°F | Resp 18 | Ht 67.0 in | Wt 104.0 lb

## 2020-10-21 DIAGNOSIS — R634 Abnormal weight loss: Secondary | ICD-10-CM | POA: Diagnosis not present

## 2020-10-21 DIAGNOSIS — F419 Anxiety disorder, unspecified: Secondary | ICD-10-CM

## 2020-10-21 DIAGNOSIS — J449 Chronic obstructive pulmonary disease, unspecified: Secondary | ICD-10-CM | POA: Diagnosis not present

## 2020-10-21 DIAGNOSIS — R7989 Other specified abnormal findings of blood chemistry: Secondary | ICD-10-CM | POA: Diagnosis not present

## 2020-10-21 DIAGNOSIS — F17210 Nicotine dependence, cigarettes, uncomplicated: Secondary | ICD-10-CM

## 2020-10-21 MED ORDER — NICOTINE 21 MG/24HR TD PT24: 21.0000 mg | MEDICATED_PATCH | Freq: Every day | TRANSDERMAL | 3 refills | Status: AC

## 2020-10-21 MED ORDER — HYDROXYZINE HCL 25 MG PO TABS: 25.0000 mg | ORAL_TABLET | Freq: Three times a day (TID) | ORAL | 3 refills | Status: DC | PRN

## 2020-10-21 NOTE — Progress Notes (Signed)
Metrics: Intervention Frequency ACO  Documented Smoking Status Yearly  Screened one or more times in 24 months  Cessation Counseling or  Active cessation medication Past 24 months  Past 24 months   Guideline developer: UpToDate (See UpToDate for funding source) Date Released: 2014       Wellness Office Visit  Subjective:  Patient ID: Sean Kemp, male    DOB: [birth year]  Age: 55 y.o. MRN: [id number]  CC: Weight loss HPI  This man has multiple sclerosis but is a longstanding smoker and has a history of COPD.  He is concerned about weight loss.  When he was seen in October, TSH was suppressed.  For some reason, free T3 and free T4 levels were not done/released even though I think they were ordered. The patient wants to quit smoking and nicotine patches helped him in the past. Past Medical History:  Diagnosis Date  . Abscess    on brain  . Anxiety 05/12/2019  . Chronic back pain   . COPD (chronic obstructive pulmonary disease) ([place]) 05/12/2019  . Drug abuse ([place])   . Kidney stones   . Multiple sclerosis ([place])   . Sciatica    Past Surgical History:  Procedure Laterality Date  . BRAIN SURGERY    . WRIST SURGERY       Family History  Problem Relation Age of Onset  . Valvular heart disease Mother   . 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   Social History   Tobacco Use  . Smoking status: Current Every Day Smoker    Packs/day: 0.50    Types: Cigarettes  . Smokeless tobacco: Never Used  . Tobacco comment: declined education  Substance Use Topics  . Alcohol use: No    Current Meds  Medication Sig  . albuterol (VENTOLIN HFA) 108 (90 Base) MCG/ACT inhaler Inhale 2 puffs into the lungs every 6 (six) hours as needed for wheezing or shortness of breath.  . ALPRAZolam (XANAX) 0.5 MG tablet Take 0.5 mg by mouth 3 (three) times  daily.  Jearl Klinefelter ELLIPTA 62.5-25 MCG/INH AEPB Inhale 1 puff into the lungs daily.  Marland Kitchen aspirin 81 MG chewable tablet aspirin 81 mg chewable tablet  Chew 1 tablet every day by oral route.  . Cholecalciferol (VITAMIN D-3) 125 MCG (5000 UT) TABS Take 5,000 Units by mouth daily.  Marland Kitchen gabapentin (NEURONTIN) 300 MG capsule Take 1 capsule (300 mg total) by mouth 3 (three) times daily.  Marland Kitchen ibuprofen (ADVIL) 800 MG tablet Take 1 tablet (800 mg total) by mouth 3 (three) times daily.  . Interferon Beta-1a (AVONEX PEN) 30 MCG/0.5ML AJKT Avonex 30 mcg/0.5 mL intramuscular pen kit  . mirtazapine (REMERON) 7.5 MG tablet TAKE ONE TABLET BY MOUTH AT BEDTIME.  . nicotine (NICODERM CQ - DOSED IN MG/24 HOURS) 21 mg/24hr patch Place 1 patch (21 mg total) onto the skin daily.  . vitamin B-12 (CYANOCOBALAMIN) 500 MCG tablet Take 500 mcg by mouth daily as needed.   . [DISCONTINUED] hydrOXYzine (ATARAX/VISTARIL) 25 MG tablet Take 1 tablet (25 mg total) by mouth every 8 (eight) hours as needed for anxiety.       Objective:   Today's Vitals: BP 133/70 (BP Location: Left Arm, Patient Position: Sitting, Cuff Size: Small)   Temp (!) 97.2 F (36.2 C) (Temporal)   Resp 18   Ht 5' 7"  (1.702 m)  Wt 104 lb (47.2 kg)   SpO2 98%   BMI 16.29 kg/m  Vitals with BMI 10/21/2020 05/24/2020 05/12/2019  Height 5' 7"  5' 7"  5' 6"   Weight 104 lbs 119 lbs 6 oz 122 lbs  BMI 16.28 84.6 96.2  Systolic 952 841 324  Diastolic 70 86 80  Pulse - 67 72  Some encounter information is confidential and restricted. Go to Review Flowsheets activity to see all data.     Physical Exam  He does appear to look somewhat cachectic.  There is no neck or supraclavicular lymphadenopathy.  There is no tremor of the outstretched hand.  He does not have a resting tachycardia.  His peripheries do not feel hot/warm.  There is no obvious exophthalmos.     Assessment   1. Abnormal TSH   2. Weight loss   3. Chronic obstructive pulmonary disease,  unspecified COPD type ([place])   4. Cigarette nicotine dependence without complication   5. Anxiety       Tests ordered Orders Placed This Encounter  Procedures  . CT CHEST LUNG CANCER SCREENING LOW DOSE WO CONTRAST  . CBC  . COMPLETE METABOLIC PANEL WITH GFR  . T3, free  . T4, free  . TSH  . Sedimentation rate     Plan: 1. We will need to investigate this weight loss as I am not really convinced that this is due to his multiple sclerosis which is what he thinks it could be.  To this end, we will repeat full thyroid function tests, blood count, complete metabolic panel and sed rate. 2. I will also send him for CT chest scan for lung cancer screening. 3. I am also going to prescribe him nicotine patch to see if we can help him quit smoking. 4. Further recommendations will depend on these results and he will follow-up with Sarah in 3 months time   Meds ordered this encounter  Medications  . nicotine (NICODERM CQ - DOSED IN MG/24 HOURS) 21 mg/24hr patch    Sig: Place 1 patch (21 mg total) onto the skin daily.    Dispense:  28 patch    Refill:  3  . hydrOXYzine (ATARAX/VISTARIL) 25 MG tablet    Sig: Take 1 tablet (25 mg total) by mouth every 8 (eight) hours as needed for anxiety.    Dispense:  30 tablet    Refill:  3    Nimish Luther Parody, MD

## 2020-10-22 LAB — CBC
HCT: 38.2 % — ABNORMAL LOW (ref 38.5–50.0)
Hemoglobin: 13.2 g/dL (ref 13.2–17.1)
MCH: 33.5 pg — ABNORMAL HIGH (ref 27.0–33.0)
MCHC: 34.6 g/dL (ref 32.0–36.0)
MCV: 97 fL (ref 80.0–100.0)
MPV: 9.8 fL (ref 7.5–12.5)
Platelets: 336 10*3/uL (ref 140–400)
RBC: 3.94 10*6/uL — ABNORMAL LOW (ref 4.20–5.80)
RDW: 13 % (ref 11.0–15.0)
WBC: 6.9 10*3/uL (ref 3.8–10.8)

## 2020-10-22 LAB — T4, FREE: Free T4: 1.2 ng/dL (ref 0.8–1.8)

## 2020-10-22 LAB — COMPLETE METABOLIC PANEL WITH GFR
AG Ratio: 1.8 (calc) (ref 1.0–2.5)
ALT: 9 U/L (ref 9–46)
AST: 12 U/L (ref 10–35)
Albumin: 4.4 g/dL (ref 3.6–5.1)
Alkaline phosphatase (APISO): 84 U/L (ref 35–144)
BUN: 24 mg/dL (ref 7–25)
CO2: 30 mmol/L (ref 20–32)
Calcium: 9.3 mg/dL (ref 8.6–10.3)
Chloride: 104 mmol/L (ref 98–110)
Creat: 0.79 mg/dL (ref 0.70–1.33)
GFR, Est African American: 118 mL/min/{1.73_m2} (ref >=60)
GFR, Est Non African American: 102 mL/min/{1.73_m2} (ref >=60)
Globulin: 2.4 g/dL (calc) (ref 1.9–3.7)
Glucose, Bld: 104 mg/dL (ref 65–139)
Potassium: 5.4 mmol/L — ABNORMAL HIGH (ref 3.5–5.3)
Sodium: 138 mmol/L (ref 135–146)
Total Bilirubin: 0.3 mg/dL (ref 0.2–1.2)
Total Protein: 6.8 g/dL (ref 6.1–8.1)

## 2020-10-22 LAB — T3, FREE: T3, Free: 2.8 pg/mL (ref 2.3–4.2)

## 2020-10-22 LAB — SEDIMENTATION RATE: Sed Rate: 14 mm/h (ref 0–20)

## 2020-10-22 LAB — TSH: TSH: 1.43 mIU/L (ref 0.40–4.50)

## 2020-10-22 NOTE — Progress Notes (Signed)
Please let the patient know the blood tests are essentially okay.  Potassium is slightly elevated so make sure he keeps well-hydrated.  Lets see what the CT scan of the chest shows.  Follow-up as scheduled.

## 2020-10-25 NOTE — Progress Notes (Signed)
Pt called and given lab results. Will start drinking more water. Will go to CT appt as schedule.

## 2020-11-10 DIAGNOSIS — R471 Dysarthria and anarthria: Secondary | ICD-10-CM | POA: Diagnosis not present

## 2020-11-10 DIAGNOSIS — M792 Neuralgia and neuritis, unspecified: Secondary | ICD-10-CM | POA: Diagnosis not present

## 2020-11-10 DIAGNOSIS — I10 Essential (primary) hypertension: Secondary | ICD-10-CM | POA: Diagnosis not present

## 2020-11-10 DIAGNOSIS — G459 Transient cerebral ischemic attack, unspecified: Secondary | ICD-10-CM | POA: Diagnosis not present

## 2020-11-10 DIAGNOSIS — G35 Multiple sclerosis: Secondary | ICD-10-CM | POA: Diagnosis not present

## 2020-11-10 DIAGNOSIS — R202 Paresthesia of skin: Secondary | ICD-10-CM | POA: Diagnosis not present

## 2020-11-10 DIAGNOSIS — Z79899 Other long term (current) drug therapy: Secondary | ICD-10-CM | POA: Diagnosis not present

## 2020-11-10 DIAGNOSIS — G8921 Chronic pain due to trauma: Secondary | ICD-10-CM | POA: Diagnosis not present

## 2020-11-11 ENCOUNTER — Other Ambulatory Visit (HOSPITAL_COMMUNITY): Payer: Self-pay | Admitting: Neurology

## 2020-11-11 DIAGNOSIS — G459 Transient cerebral ischemic attack, unspecified: Secondary | ICD-10-CM

## 2020-11-11 DIAGNOSIS — R471 Dysarthria and anarthria: Secondary | ICD-10-CM

## 2020-11-19 ENCOUNTER — Ambulatory Visit (HOSPITAL_COMMUNITY): Admission: RE | Admit: 2020-11-19 | Payer: Medicare Other | Source: Ambulatory Visit

## 2020-11-26 ENCOUNTER — Encounter (HOSPITAL_COMMUNITY): Payer: Self-pay

## 2020-11-26 ENCOUNTER — Ambulatory Visit (HOSPITAL_COMMUNITY): Payer: Medicare Other | Attending: Neurology

## 2020-12-27 ENCOUNTER — Other Ambulatory Visit (INDEPENDENT_AMBULATORY_CARE_PROVIDER_SITE_OTHER): Payer: Self-pay

## 2020-12-27 DIAGNOSIS — F419 Anxiety disorder, unspecified: Secondary | ICD-10-CM

## 2020-12-27 DIAGNOSIS — J449 Chronic obstructive pulmonary disease, unspecified: Secondary | ICD-10-CM

## 2020-12-28 MED ORDER — HYDROXYZINE HCL 25 MG PO TABS: 25.0000 mg | ORAL_TABLET | Freq: Three times a day (TID) | ORAL | 3 refills | Status: AC | PRN

## 2021-01-19 ENCOUNTER — Other Ambulatory Visit (INDEPENDENT_AMBULATORY_CARE_PROVIDER_SITE_OTHER): Payer: Self-pay | Admitting: Internal Medicine

## 2021-01-19 ENCOUNTER — Ambulatory Visit (INDEPENDENT_AMBULATORY_CARE_PROVIDER_SITE_OTHER): Payer: Medicare Other | Admitting: Internal Medicine

## 2021-01-24 ENCOUNTER — Encounter (INDEPENDENT_AMBULATORY_CARE_PROVIDER_SITE_OTHER): Payer: Self-pay

## 2021-01-24 ENCOUNTER — Ambulatory Visit (INDEPENDENT_AMBULATORY_CARE_PROVIDER_SITE_OTHER): Payer: Medicare Other

## 2021-01-24 ENCOUNTER — Other Ambulatory Visit: Payer: Self-pay

## 2021-01-24 NOTE — Progress Notes (Signed)
Erroneous Encounter

## 2021-01-27 ENCOUNTER — Ambulatory Visit (INDEPENDENT_AMBULATORY_CARE_PROVIDER_SITE_OTHER): Payer: Medicare Other | Admitting: Nurse Practitioner

## 2021-02-24 ENCOUNTER — Ambulatory Visit (INDEPENDENT_AMBULATORY_CARE_PROVIDER_SITE_OTHER): Payer: Medicare Other | Admitting: Internal Medicine

## 2021-02-28 DIAGNOSIS — I10 Essential (primary) hypertension: Secondary | ICD-10-CM | POA: Diagnosis not present

## 2021-02-28 DIAGNOSIS — G35 Multiple sclerosis: Secondary | ICD-10-CM | POA: Diagnosis not present

## 2021-02-28 DIAGNOSIS — G8921 Chronic pain due to trauma: Secondary | ICD-10-CM | POA: Diagnosis not present

## 2021-02-28 DIAGNOSIS — R471 Dysarthria and anarthria: Secondary | ICD-10-CM | POA: Diagnosis not present

## 2021-02-28 DIAGNOSIS — R202 Paresthesia of skin: Secondary | ICD-10-CM | POA: Diagnosis not present

## 2021-02-28 DIAGNOSIS — M792 Neuralgia and neuritis, unspecified: Secondary | ICD-10-CM | POA: Diagnosis not present

## 2021-02-28 DIAGNOSIS — G459 Transient cerebral ischemic attack, unspecified: Secondary | ICD-10-CM | POA: Diagnosis not present

## 2021-02-28 DIAGNOSIS — Z79899 Other long term (current) drug therapy: Secondary | ICD-10-CM | POA: Diagnosis not present

## 2021-03-11 ENCOUNTER — Encounter (HOSPITAL_COMMUNITY): Payer: Self-pay

## 2021-03-11 ENCOUNTER — Ambulatory Visit (HOSPITAL_COMMUNITY): Admission: RE | Admit: 2021-03-11 | Payer: Medicare Other | Source: Ambulatory Visit

## 2021-07-18 DIAGNOSIS — I10 Essential (primary) hypertension: Secondary | ICD-10-CM | POA: Diagnosis not present

## 2021-07-18 DIAGNOSIS — R202 Paresthesia of skin: Secondary | ICD-10-CM | POA: Diagnosis not present

## 2021-07-18 DIAGNOSIS — Z79899 Other long term (current) drug therapy: Secondary | ICD-10-CM | POA: Diagnosis not present

## 2021-07-18 DIAGNOSIS — R413 Other amnesia: Secondary | ICD-10-CM | POA: Diagnosis not present

## 2021-07-18 DIAGNOSIS — M792 Neuralgia and neuritis, unspecified: Secondary | ICD-10-CM | POA: Diagnosis not present

## 2021-07-18 DIAGNOSIS — G8921 Chronic pain due to trauma: Secondary | ICD-10-CM | POA: Diagnosis not present

## 2021-07-18 DIAGNOSIS — G35 Multiple sclerosis: Secondary | ICD-10-CM | POA: Diagnosis not present

## 2021-07-18 DIAGNOSIS — R471 Dysarthria and anarthria: Secondary | ICD-10-CM | POA: Diagnosis not present

## 2021-07-18 DIAGNOSIS — F411 Generalized anxiety disorder: Secondary | ICD-10-CM | POA: Diagnosis not present

## 2021-07-19 ENCOUNTER — Other Ambulatory Visit (HOSPITAL_COMMUNITY): Payer: Self-pay | Admitting: Neurology

## 2021-07-19 ENCOUNTER — Other Ambulatory Visit: Payer: Self-pay | Admitting: Neurology

## 2021-07-19 DIAGNOSIS — G35 Multiple sclerosis: Secondary | ICD-10-CM

## 2021-07-25 DIAGNOSIS — R202 Paresthesia of skin: Secondary | ICD-10-CM | POA: Diagnosis not present

## 2021-07-25 DIAGNOSIS — F411 Generalized anxiety disorder: Secondary | ICD-10-CM | POA: Diagnosis not present

## 2021-07-25 DIAGNOSIS — G8921 Chronic pain due to trauma: Secondary | ICD-10-CM | POA: Diagnosis not present

## 2021-07-25 DIAGNOSIS — I10 Essential (primary) hypertension: Secondary | ICD-10-CM | POA: Diagnosis not present

## 2021-07-25 DIAGNOSIS — Z79899 Other long term (current) drug therapy: Secondary | ICD-10-CM | POA: Diagnosis not present

## 2021-07-25 DIAGNOSIS — F1129 Opioid dependence with unspecified opioid-induced disorder: Secondary | ICD-10-CM | POA: Diagnosis not present

## 2021-07-25 DIAGNOSIS — G35 Multiple sclerosis: Secondary | ICD-10-CM | POA: Diagnosis not present

## 2021-07-25 DIAGNOSIS — R413 Other amnesia: Secondary | ICD-10-CM | POA: Diagnosis not present

## 2021-07-25 DIAGNOSIS — M792 Neuralgia and neuritis, unspecified: Secondary | ICD-10-CM | POA: Diagnosis not present

## 2021-07-25 DIAGNOSIS — R471 Dysarthria and anarthria: Secondary | ICD-10-CM | POA: Diagnosis not present

## 2021-07-26 ENCOUNTER — Encounter (HOSPITAL_COMMUNITY)
Admission: RE | Admit: 2021-07-26 | Discharge: 2021-07-26 | Disposition: A | Discharge status: Home / Self Care | Payer: Medicare HMO | Source: Ambulatory Visit | Attending: Neurology | Admitting: Neurology

## 2021-07-27 ENCOUNTER — Encounter (HOSPITAL_COMMUNITY)
Admission: RE | Admit: 2021-07-27 | Discharge: 2021-07-27 | Disposition: A | Discharge status: Home / Self Care | Payer: Medicare HMO | Source: Ambulatory Visit | Attending: Neurology | Admitting: Neurology

## 2021-07-28 ENCOUNTER — Encounter (HOSPITAL_COMMUNITY)
Admission: RE | Admit: 2021-07-28 | Discharge: 2021-07-28 | Disposition: A | Discharge status: Home / Self Care | Payer: Medicare HMO | Source: Ambulatory Visit | Attending: Neurology | Admitting: Neurology

## 2021-08-24 DIAGNOSIS — F411 Generalized anxiety disorder: Secondary | ICD-10-CM | POA: Diagnosis not present

## 2021-08-24 DIAGNOSIS — R471 Dysarthria and anarthria: Secondary | ICD-10-CM | POA: Diagnosis not present

## 2021-08-24 DIAGNOSIS — R202 Paresthesia of skin: Secondary | ICD-10-CM | POA: Diagnosis not present

## 2021-08-24 DIAGNOSIS — Z79899 Other long term (current) drug therapy: Secondary | ICD-10-CM | POA: Diagnosis not present

## 2021-08-24 DIAGNOSIS — I10 Essential (primary) hypertension: Secondary | ICD-10-CM | POA: Diagnosis not present

## 2021-08-24 DIAGNOSIS — F1129 Opioid dependence with unspecified opioid-induced disorder: Secondary | ICD-10-CM | POA: Diagnosis not present

## 2021-08-24 DIAGNOSIS — G8921 Chronic pain due to trauma: Secondary | ICD-10-CM | POA: Diagnosis not present

## 2021-08-24 DIAGNOSIS — G35 Multiple sclerosis: Secondary | ICD-10-CM | POA: Diagnosis not present

## 2021-08-24 DIAGNOSIS — M792 Neuralgia and neuritis, unspecified: Secondary | ICD-10-CM | POA: Diagnosis not present

## 2021-09-26 DIAGNOSIS — G8921 Chronic pain due to trauma: Secondary | ICD-10-CM | POA: Diagnosis not present

## 2021-09-26 DIAGNOSIS — F411 Generalized anxiety disorder: Secondary | ICD-10-CM | POA: Diagnosis not present

## 2021-09-26 DIAGNOSIS — R413 Other amnesia: Secondary | ICD-10-CM | POA: Diagnosis not present

## 2021-09-26 DIAGNOSIS — F1129 Opioid dependence with unspecified opioid-induced disorder: Secondary | ICD-10-CM | POA: Diagnosis not present

## 2021-09-26 DIAGNOSIS — G35 Multiple sclerosis: Secondary | ICD-10-CM | POA: Diagnosis not present

## 2021-09-26 DIAGNOSIS — R202 Paresthesia of skin: Secondary | ICD-10-CM | POA: Diagnosis not present

## 2021-09-26 DIAGNOSIS — M792 Neuralgia and neuritis, unspecified: Secondary | ICD-10-CM | POA: Diagnosis not present

## 2021-09-26 DIAGNOSIS — R471 Dysarthria and anarthria: Secondary | ICD-10-CM | POA: Diagnosis not present

## 2021-09-26 DIAGNOSIS — Z79899 Other long term (current) drug therapy: Secondary | ICD-10-CM | POA: Diagnosis not present

## 2021-10-11 DIAGNOSIS — F1129 Opioid dependence with unspecified opioid-induced disorder: Secondary | ICD-10-CM | POA: Diagnosis not present

## 2021-10-11 DIAGNOSIS — G35 Multiple sclerosis: Secondary | ICD-10-CM | POA: Diagnosis not present

## 2021-10-11 DIAGNOSIS — R413 Other amnesia: Secondary | ICD-10-CM | POA: Diagnosis not present

## 2021-10-11 DIAGNOSIS — Z79899 Other long term (current) drug therapy: Secondary | ICD-10-CM | POA: Diagnosis not present

## 2021-10-11 DIAGNOSIS — M792 Neuralgia and neuritis, unspecified: Secondary | ICD-10-CM | POA: Diagnosis not present

## 2021-10-11 DIAGNOSIS — R202 Paresthesia of skin: Secondary | ICD-10-CM | POA: Diagnosis not present

## 2021-10-11 DIAGNOSIS — R471 Dysarthria and anarthria: Secondary | ICD-10-CM | POA: Diagnosis not present

## 2021-10-11 DIAGNOSIS — G8921 Chronic pain due to trauma: Secondary | ICD-10-CM | POA: Diagnosis not present

## 2021-10-11 DIAGNOSIS — F411 Generalized anxiety disorder: Secondary | ICD-10-CM | POA: Diagnosis not present

## 2021-10-25 DIAGNOSIS — F1429 Cocaine dependence with unspecified cocaine-induced disorder: Secondary | ICD-10-CM | POA: Diagnosis not present

## 2021-10-25 DIAGNOSIS — F411 Generalized anxiety disorder: Secondary | ICD-10-CM | POA: Diagnosis not present

## 2021-10-25 DIAGNOSIS — Z79899 Other long term (current) drug therapy: Secondary | ICD-10-CM | POA: Diagnosis not present

## 2021-10-25 DIAGNOSIS — G35 Multiple sclerosis: Secondary | ICD-10-CM | POA: Diagnosis not present

## 2021-10-25 DIAGNOSIS — G890 Central pain syndrome: Secondary | ICD-10-CM | POA: Diagnosis not present

## 2021-10-25 DIAGNOSIS — J449 Chronic obstructive pulmonary disease, unspecified: Secondary | ICD-10-CM | POA: Diagnosis not present

## 2021-10-25 DIAGNOSIS — R202 Paresthesia of skin: Secondary | ICD-10-CM | POA: Diagnosis not present

## 2021-10-25 DIAGNOSIS — G43C1 Periodic headache syndromes in child or adult, intractable: Secondary | ICD-10-CM | POA: Diagnosis not present

## 2021-10-25 DIAGNOSIS — M792 Neuralgia and neuritis, unspecified: Secondary | ICD-10-CM | POA: Diagnosis not present

## 2021-10-25 DIAGNOSIS — F1129 Opioid dependence with unspecified opioid-induced disorder: Secondary | ICD-10-CM | POA: Diagnosis not present

## 2021-11-08 DIAGNOSIS — F1429 Cocaine dependence with unspecified cocaine-induced disorder: Secondary | ICD-10-CM | POA: Diagnosis not present

## 2021-11-08 DIAGNOSIS — F1129 Opioid dependence with unspecified opioid-induced disorder: Secondary | ICD-10-CM | POA: Diagnosis not present

## 2021-11-08 DIAGNOSIS — F411 Generalized anxiety disorder: Secondary | ICD-10-CM | POA: Diagnosis not present

## 2021-11-08 DIAGNOSIS — J449 Chronic obstructive pulmonary disease, unspecified: Secondary | ICD-10-CM | POA: Diagnosis not present

## 2021-11-08 DIAGNOSIS — M792 Neuralgia and neuritis, unspecified: Secondary | ICD-10-CM | POA: Diagnosis not present

## 2021-11-08 DIAGNOSIS — Z79899 Other long term (current) drug therapy: Secondary | ICD-10-CM | POA: Diagnosis not present

## 2021-11-08 DIAGNOSIS — G35 Multiple sclerosis: Secondary | ICD-10-CM | POA: Diagnosis not present

## 2021-11-08 DIAGNOSIS — G890 Central pain syndrome: Secondary | ICD-10-CM | POA: Diagnosis not present

## 2021-11-21 DIAGNOSIS — F411 Generalized anxiety disorder: Secondary | ICD-10-CM | POA: Diagnosis not present

## 2021-11-21 DIAGNOSIS — Z79899 Other long term (current) drug therapy: Secondary | ICD-10-CM | POA: Diagnosis not present

## 2021-11-21 DIAGNOSIS — M792 Neuralgia and neuritis, unspecified: Secondary | ICD-10-CM | POA: Diagnosis not present

## 2021-11-21 DIAGNOSIS — F1129 Opioid dependence with unspecified opioid-induced disorder: Secondary | ICD-10-CM | POA: Diagnosis not present

## 2021-11-21 DIAGNOSIS — F1429 Cocaine dependence with unspecified cocaine-induced disorder: Secondary | ICD-10-CM | POA: Diagnosis not present

## 2021-11-21 DIAGNOSIS — G35 Multiple sclerosis: Secondary | ICD-10-CM | POA: Diagnosis not present

## 2022-04-07 ENCOUNTER — Other Ambulatory Visit: Payer: Self-pay

## 2022-04-07 ENCOUNTER — Emergency Department (HOSPITAL_COMMUNITY)
Admission: EM | Admit: 2022-04-07 | Discharge: 2022-04-07 | Disposition: A | Discharge status: Home / Self Care | Payer: Medicare HMO | Attending: Emergency Medicine | Admitting: Emergency Medicine

## 2022-04-07 DIAGNOSIS — F1990 Other psychoactive substance use, unspecified, uncomplicated: Secondary | ICD-10-CM | POA: Insufficient documentation

## 2022-04-07 DIAGNOSIS — Z7982 Long term (current) use of aspirin: Secondary | ICD-10-CM | POA: Insufficient documentation

## 2022-04-07 DIAGNOSIS — F1910 Other psychoactive substance abuse, uncomplicated: Secondary | ICD-10-CM | POA: Diagnosis not present

## 2022-04-07 NOTE — ED Provider Notes (Signed)
[facility] EMERGENCY DEPARTMENT Provider Note   CSN: [id number] Arrival date & time: 04/07/22  1437     History  Chief Complaint  Patient presents with   Addiction Problem    Sean Kemp is a 56 y.o. male.  HPI   Patient with medical history of substance use disorder including cocaine, benzos, opiates, amphetamine presents today due to desire to seek treatment.  Last use yesterday, denies any IV drug use.  No SI or HI, no nausea or vomiting.  States he would like assistance with transportation to [facility] in [place].  Last used illicit drugs yesterday.  No alcohol use.  Home Medications Prior to Admission medications   Medication Sig Start Date End Date Taking? Authorizing Provider  albuterol (VENTOLIN HFA) 108 (90 Base) MCG/ACT inhaler Inhale 2 puffs into the lungs every 6 (six) hours as needed for wheezing or shortness of breath. 05/24/20   Ailene Ards, NP  ALPRAZolam Duanne Moron) 0.5 MG tablet Take 0.5 mg by mouth 3 (three) times daily. 10/13/20   [provider]  ANORO ELLIPTA 62.5-25 MCG/INH AEPB Inhale 1 puff into the lungs daily. 05/24/20   Ailene Ards, NP  aspirin 81 MG chewable tablet aspirin 81 mg chewable tablet  Chew 1 tablet every day by oral route.    [provider]  Cholecalciferol (VITAMIN D-3) 125 MCG (5000 UT) TABS Take 5,000 Units by mouth daily.    [provider]  gabapentin (NEURONTIN) 300 MG capsule Take 1 capsule (300 mg total) by mouth 3 (three) times daily. 02/18/18   Starkes-Perry, Gayland Curry, FNP  hydrOXYzine (ATARAX/VISTARIL) 25 MG tablet Take 1 tablet (25 mg total) by mouth every 8 (eight) hours as needed for anxiety. 12/28/20   Doree Albee, MD  ibuprofen (ADVIL) 800 MG tablet Take 1 tablet (800 mg total) by mouth 3 (three) times daily. 02/11/19   Idol, Almyra Free, PA-C  Interferon Beta-1a (AVONEX PEN) 30 MCG/0.5ML AJKT Avonex 30 mcg/0.5 mL intramuscular pen kit    [provider]  mirtazapine (REMERON) 7.5 MG  tablet TAKE ONE TABLET BY MOUTH AT BEDTIME. 08/25/20   Gosrani, Nimish C, MD  nicotine (NICODERM CQ - DOSED IN MG/24 HOURS) 21 mg/24hr patch Place 1 patch (21 mg total) onto the skin daily. 10/21/20   Doree Albee, MD  vitamin B-12 (CYANOCOBALAMIN) 500 MCG tablet Take 500 mcg by mouth daily as needed.     [provider]      Allergies    Ultram [tramadol hcl] and Valium [diazepam]    Review of Systems   Review of Systems  Physical Exam Updated Vital Signs BP 130/87 (BP Location: Right Arm)   Pulse 85   Temp 98 F (36.7 C) (Oral)   Resp 16   Ht 5' 7"  (1.702 m)   Wt 54.4 kg   SpO2 95%   BMI 18.79 kg/m  Physical Exam Vitals and nursing note reviewed. Exam conducted with a chaperone present.  Constitutional:      Appearance: Normal appearance.  HENT:     Head: Normocephalic and atraumatic.  Eyes:     General: No scleral icterus.       Right eye: No discharge.        Left eye: No discharge.     Extraocular Movements: Extraocular movements intact.     Pupils: Pupils are equal, round, and reactive to light.  Cardiovascular:     Rate and Rhythm: Normal rate and regular rhythm.  Pulses: Normal pulses.     Heart sounds: Normal heart sounds. No murmur heard.    No friction rub. No gallop.  Pulmonary:     Effort: Pulmonary effort is normal. No respiratory distress.     Breath sounds: Normal breath sounds.  Abdominal:     General: Abdomen is flat. Bowel sounds are normal. There is no distension.     Palpations: Abdomen is soft.     Tenderness: There is no abdominal tenderness.  Skin:    General: Skin is warm and dry.     Coloration: Skin is not jaundiced.  Neurological:     Mental Status: He is alert. Mental status is at baseline.     Coordination: Coordination normal.  Psychiatric:     Comments: Goal oriented speech, hopeful affect.     ED Results / Procedures / Treatments   Labs (all labs ordered are listed, but only abnormal results are  displayed) Labs Reviewed - No data to display  EKG None  Radiology No results found.  Procedures Procedures    Medications Ordered in ED Medications - No data to display  ED Course/ Medical Decision Making/ A&P                           Medical Decision Making  Patient presents to ED requesting addiction treatment.  History of substance use disorder, last use yesterday.  No signs of acute withdrawal, clinically patient appears stable and is not septic.    BP 130/87 (BP Location: Right Arm)   Pulse 85   Temp 98 F (36.7 C) (Oral)   Resp 16   Ht 5' 7"  (1.702 m)   Wt 54.4 kg   SpO2 95%   BMI 18.79 kg/m   Unfortunately, patient does not have transportation which is limiting his ability to access care.  Resources for treatment facilities provided but unable to provide cab voucher for that far.  He states he will try to arrange transport with a friend over the sister.  Patient discharged stable condition.        Final Clinical Impression(s) / ED Diagnoses Final diagnoses:  Polysubstance use disorder    Rx / DC Orders ED Discharge Orders     None         Sherrill Raring, PA-C 04/07/22 2332    Fredia Sorrow, MD 04/08/22 [phone]

## 2022-04-07 NOTE — Discharge Instructions (Signed)
Return to the emergency department if you have signs of acute withdrawal, thoughts of hurting yourself or others, or physical symptoms.  Otherwise please treatment outpatient for substance use treatment.

## 2022-04-07 NOTE — ED Triage Notes (Addendum)
Pt to ED requesting detox from drug addiction ongoing x 30 years. Pt reports opioids and cocaine. Hx of going to rehab, last rehab treatment 1 year ago. Reports last drug use yesterday. Requesting transportation to [facility] in [place]

## 2022-04-11 ENCOUNTER — Telehealth (HOSPITAL_COMMUNITY): Payer: Self-pay | Admitting: Licensed Clinical Social Worker

## 2022-04-11 ENCOUNTER — Encounter (HOSPITAL_COMMUNITY): Payer: Self-pay | Admitting: Licensed Clinical Social Worker

## 2022-04-11 NOTE — Telephone Encounter (Addendum)
The therapist attempts to reach Sean Kemp as he apparently left a message with this office asking for assistance in receiving substance use treatment. He lives in [place] and has no transportation.  The call rings over ten times before playing a recording, "please enter your remote access code" not allowing this therapist to leave a HIPAA-compliant voicemail.   The therapist mails a letter to [place] with this therapist's direct contact number.  Adam Phenix, [place], LCSW, [employer], [place] 04/11/2022

## 2022-04-14 ENCOUNTER — Telehealth (HOSPITAL_COMMUNITY): Payer: Self-pay | Admitting: Licensed Clinical Social Worker

## 2022-04-14 NOTE — Telephone Encounter (Signed)
The therapist attempts to return [facility] voicemail from yesterday; however, is again unable to leave a HIPAA-compliant voicemail as after the phone rings ten times, the therapist receives a message saying, "please enter your remote access code" with no option to leave a voicemail.  Adam Phenix, [place], LCSW, [facility], [place] 04/14/2022

## 2022-04-20 ENCOUNTER — Telehealth (HOSPITAL_COMMUNITY): Payer: Self-pay | Admitting: Licensed Clinical Social Worker

## 2022-04-20 NOTE — Telephone Encounter (Signed)
The therapist returns Sean Kemp call confirming his identity via two identifiers. He says that he has been a "drug addict" for about 30 years. He says that he has gone to rehab after rehab with the last one being [facility] at [place]. He called [facility] a couple weeks ago and was told to get there as soon as he can; however, he has no transportation as his sister's car broke down. He says that [facility] could help him get to a one or two year program. Sean Kemp says that he is disabled and has [facility] but does not have Medicaid as he draws too much.  He last used crack cocaine a couple of days ago. He says that he last used Adderall four months ago and had a serious problem with pain pills but has not used them or alcohol in two years.   The therapist provides Sean Kemp with the contact number for [employer]. The therapist also suggests that Sean Kemp can call the [employer] that has assisted him in the past and ask if he can get a ride to [place]. Lastly, the therapist recommends that Sean Kemp can call [facility] [employer] to see if they can come up with an option to assist him in getting to [place].  [name] says that he will pursue these options and call this therapist back on a p.r.n. basis.  Sean Kemp, [place], LCSW, [facility], [place] 04/20/2022

## 2022-05-17 ENCOUNTER — Emergency Department (HOSPITAL_COMMUNITY)
Admission: EM | Admit: 2022-05-17 | Discharge: 2022-05-17 | Disposition: A | Discharge status: Home / Self Care | Payer: Medicare HMO | Attending: Emergency Medicine | Admitting: Emergency Medicine

## 2022-05-17 ENCOUNTER — Other Ambulatory Visit: Payer: Self-pay

## 2022-05-17 ENCOUNTER — Encounter (HOSPITAL_COMMUNITY): Payer: Self-pay | Admitting: Emergency Medicine

## 2022-05-17 ENCOUNTER — Emergency Department (HOSPITAL_COMMUNITY): Payer: Medicare HMO

## 2022-05-17 DIAGNOSIS — Z7982 Long term (current) use of aspirin: Secondary | ICD-10-CM | POA: Diagnosis not present

## 2022-05-17 DIAGNOSIS — J449 Chronic obstructive pulmonary disease, unspecified: Secondary | ICD-10-CM | POA: Diagnosis not present

## 2022-05-17 DIAGNOSIS — M7021 Olecranon bursitis, right elbow: Secondary | ICD-10-CM | POA: Diagnosis not present

## 2022-05-17 DIAGNOSIS — Z7951 Long term (current) use of inhaled steroids: Secondary | ICD-10-CM | POA: Insufficient documentation

## 2022-05-17 DIAGNOSIS — X58XXXA Exposure to other specified factors, initial encounter: Secondary | ICD-10-CM | POA: Insufficient documentation

## 2022-05-17 DIAGNOSIS — Y9389 Activity, other specified: Secondary | ICD-10-CM | POA: Insufficient documentation

## 2022-05-17 DIAGNOSIS — M7989 Other specified soft tissue disorders: Secondary | ICD-10-CM | POA: Diagnosis not present

## 2022-05-17 MED ORDER — IBUPROFEN 800 MG PO TABS
800.0000 mg | ORAL_TABLET | Freq: Once | ORAL | Status: AC
Start: 2022-05-17 — End: 2022-05-17
  Administered 2022-05-17: 800 mg via ORAL
  Filled 2022-05-17: qty 1

## 2022-05-17 NOTE — ED Provider Notes (Signed)
[facility] Provider Note   CSN: [id number] Arrival date & time: 05/17/22  1617     History  Chief Complaint  Patient presents with   Joint Swelling    Sean Kemp is a 56 y.o. male. With past medical history of COPD, polysubstance abuse who presents to the emergency department with joint swelling.  States he began having right elbow swelling on Monday. Noticed it swell overnight. He states it has been present ever since. Denies any known trauma to the arm or elbow. He denies this happening before. Denies fevers. Denies redness to the elbow or heat at home. He denies occupation or activity with repetitive movement of the arm. He denies IVDU. Has not taken any medication or ice to alleviate symptoms.    HPI     Home Medications Prior to Admission medications   Medication Sig Start Date End Date Taking? Authorizing Provider  albuterol (VENTOLIN HFA) 108 (90 Base) MCG/ACT inhaler Inhale 2 puffs into the lungs every 6 (six) hours as needed for wheezing or shortness of breath. 05/24/20   Ailene Ards, NP  ALPRAZolam Duanne Moron) 0.5 MG tablet Take 0.5 mg by mouth 3 (three) times daily. 10/13/20   [provider]  ANORO ELLIPTA 62.5-25 MCG/INH AEPB Inhale 1 puff into the lungs daily. 05/24/20   Ailene Ards, NP  aspirin 81 MG chewable tablet aspirin 81 mg chewable tablet  Chew 1 tablet every day by oral route.    [provider]  Cholecalciferol (VITAMIN D-3) 125 MCG (5000 UT) TABS Take 5,000 Units by mouth daily.    [provider]  gabapentin (NEURONTIN) 300 MG capsule Take 1 capsule (300 mg total) by mouth 3 (three) times daily. 02/18/18   Starkes-Perry, Gayland Curry, FNP  hydrOXYzine (ATARAX/VISTARIL) 25 MG tablet Take 1 tablet (25 mg total) by mouth every 8 (eight) hours as needed for anxiety. 12/28/20   Doree Albee, MD  ibuprofen (ADVIL) 800 MG tablet Take 1 tablet (800 mg total) by mouth 3 (three) times daily. 02/11/19   Idol, Almyra Free,  PA-C  Interferon Beta-1a (AVONEX PEN) 30 MCG/0.5ML AJKT Avonex 30 mcg/0.5 mL intramuscular pen kit    [provider]  mirtazapine (REMERON) 7.5 MG tablet TAKE ONE TABLET BY MOUTH AT BEDTIME. 08/25/20   Gosrani, Nimish C, MD  nicotine (NICODERM CQ - DOSED IN MG/24 HOURS) 21 mg/24hr patch Place 1 patch (21 mg total) onto the skin daily. 10/21/20   Doree Albee, MD  vitamin B-12 (CYANOCOBALAMIN) 500 MCG tablet Take 500 mcg by mouth daily as needed.     [provider]      Allergies    Ultram [tramadol hcl] and Valium [diazepam]    Review of Systems   Review of Systems  Musculoskeletal:  Positive for joint swelling.  All other systems reviewed and are negative.   Physical Exam Updated Vital Signs BP 137/86   Pulse 62   Temp 98.2 F (36.8 C)   Resp 18   Ht _0  (1.702 m)   Wt 54.4 kg   SpO2 97%   BMI 18.79 kg/m  Physical Exam Vitals and nursing note reviewed.  Constitutional:      General: He is not in acute distress.    Appearance: Normal appearance. He is not ill-appearing or toxic-appearing.  HENT:     Head: Normocephalic and atraumatic.  Eyes:     General: No scleral icterus. Pulmonary:     Effort: Pulmonary effort is normal.  No respiratory distress.  Musculoskeletal:     Right elbow: Effusion present. Normal range of motion. No tenderness.     Comments: Effusion to the olecranon bursa. Non tender to palpation. No surrounding redness or heat. Normal ROM without pain   Skin:    Findings: No rash.  Neurological:     General: No focal deficit present.     Mental Status: He is alert.  Psychiatric:        Mood and Affect: Mood normal.        Behavior: Behavior normal.        Thought Content: Thought content normal.        Judgment: Judgment normal.    ED Results / Procedures / Treatments   Labs (all labs ordered are listed, but only abnormal results are displayed) Labs Reviewed - No data to display  EKG None  Radiology DG Elbow Complete  Right  Result Date: 05/17/2022 CLINICAL DATA:  Atraumatic right elbow swelling. EXAM: RIGHT ELBOW - COMPLETE 3+ VIEW COMPARISON:  None Available. FINDINGS: There is no evidence of fracture, dislocation, or joint effusion. There is no evidence of arthropathy or other focal bone abnormality. Moderate to marked severity focal dorsal soft tissue swelling is seen along the right olecranon process. IMPRESSION: Moderate to marked severity focal dorsal soft tissue swelling along the right olecranon process, consistent with olecranon bursitis. Electronically Signed   By: Virgina Norfolk M.D.   On: 05/17/2022 18:56    Procedures Procedures    Medications Ordered in ED Medications  ibuprofen (ADVIL) tablet 800 mg (800 mg Oral Given 05/17/22 1838)    ED Course/ Medical Decision Making/ A&P                           Medical Decision Making Amount and/or Complexity of Data Reviewed Radiology: ordered.  Risk Prescription drug management.  This patient presents to the ED with chief complaint(s) of elbow swelling with pertinent past medical history of polysubstance abuse, COPD which further complicates the presenting complaint. The complaint involves an extensive differential diagnosis and also carries with it a high risk of complications and morbidity.    The differential diagnosis includes fracture, subluxation, effusion, septic joint, bursitis, gouty arthritis, arthritis, etc.    Additional history obtained: Additional history obtained from  none available Records reviewed Care Everywhere/External Records and Primary Care Documents  ED Course and Reassessment: 56 year old male who presents to the emergency department for swelling to the right elbow.  Physical exam consistent with an olecranon bursitis. There is no redness, heat, decreased ROM or infectious symptoms concerning for septic joint. No overlying cellulitis.   Xray with bursitis with no underlying spur or osteo.   Was given ice and  ibuprofen here in the emergency department.  Instructed on RICE therapy for home, but otherwise feel he needs no intervention at this time. Given return precautions for septic joint symptoms and he verbalizes understanding.   Independent labs interpretation:  The following labs were independently interpreted: n/a  Independent visualization of imaging: - I independently visualized the following imaging with scope of interpretation limited to determining acute life threatening conditions related to emergency care: xray right elbow, which revealed moderate-severe olecranon bursitis   Consultation: - Consulted or discussed management/test interpretation w/ external professional: not indicated   Consideration for admission or further workup: not indicated  Social Determinants of health: none identified  Final Clinical Impression(s) / ED Diagnoses Final diagnoses:  Olecranon bursitis of  right elbow    Rx / DC Orders ED Discharge Orders     None         Mickie Hillier, PA-C 05/17/22 1944    Noemi Chapel, MD 05/19/22 1228

## 2022-05-17 NOTE — ED Triage Notes (Signed)
Pt to ER with c/o right elbow swelling that he noted on Monday.  Pt denies injury or pain at this time.

## 2022-05-17 NOTE — Discharge Instructions (Signed)
You were seen in the emergency department for olecranon bursitis. This is an inflammation of the bursa sac that sits over the elbow. Please treat this with rest, ice and NSAIDs such as ibuprofen/aleve/motrin to reduce inflammation. Please return if it turns red, is hot to touch, or you are not able to move your elbow due to pain as this may be a sign of infection.

## 2022-08-24 DIAGNOSIS — M545 Low back pain, unspecified: Secondary | ICD-10-CM | POA: Diagnosis not present

## 2022-08-24 DIAGNOSIS — F419 Anxiety disorder, unspecified: Secondary | ICD-10-CM | POA: Diagnosis not present

## 2022-08-24 DIAGNOSIS — J449 Chronic obstructive pulmonary disease, unspecified: Secondary | ICD-10-CM | POA: Diagnosis not present

## 2022-08-24 DIAGNOSIS — E559 Vitamin D deficiency, unspecified: Secondary | ICD-10-CM | POA: Diagnosis not present

## 2022-08-24 DIAGNOSIS — Z0189 Encounter for other specified special examinations: Secondary | ICD-10-CM | POA: Diagnosis not present

## 2022-08-24 DIAGNOSIS — G060 Intracranial abscess and granuloma: Secondary | ICD-10-CM | POA: Diagnosis not present

## 2022-08-24 DIAGNOSIS — G8929 Other chronic pain: Secondary | ICD-10-CM | POA: Diagnosis not present

## 2022-08-24 DIAGNOSIS — F1911 Other psychoactive substance abuse, in remission: Secondary | ICD-10-CM | POA: Diagnosis not present

## 2022-08-24 DIAGNOSIS — G35 Multiple sclerosis: Secondary | ICD-10-CM | POA: Diagnosis not present

## 2023-01-16 DIAGNOSIS — F1120 Opioid dependence, uncomplicated: Secondary | ICD-10-CM | POA: Diagnosis not present

## 2023-01-23 DIAGNOSIS — F1120 Opioid dependence, uncomplicated: Secondary | ICD-10-CM | POA: Diagnosis not present

## 2023-01-30 DIAGNOSIS — F1120 Opioid dependence, uncomplicated: Secondary | ICD-10-CM | POA: Diagnosis not present

## 2023-01-31 DIAGNOSIS — F1120 Opioid dependence, uncomplicated: Secondary | ICD-10-CM | POA: Diagnosis not present

## 2023-02-06 DIAGNOSIS — F1120 Opioid dependence, uncomplicated: Secondary | ICD-10-CM | POA: Diagnosis not present

## 2023-02-13 DIAGNOSIS — F1120 Opioid dependence, uncomplicated: Secondary | ICD-10-CM | POA: Diagnosis not present

## 2023-02-20 DIAGNOSIS — F1120 Opioid dependence, uncomplicated: Secondary | ICD-10-CM | POA: Diagnosis not present

## 2023-02-21 DIAGNOSIS — F1120 Opioid dependence, uncomplicated: Secondary | ICD-10-CM | POA: Diagnosis not present

## 2023-02-21 DIAGNOSIS — G35 Multiple sclerosis: Secondary | ICD-10-CM | POA: Diagnosis not present

## 2023-02-27 DIAGNOSIS — F1120 Opioid dependence, uncomplicated: Secondary | ICD-10-CM | POA: Diagnosis not present

## 2023-03-06 DIAGNOSIS — F1120 Opioid dependence, uncomplicated: Secondary | ICD-10-CM | POA: Diagnosis not present

## 2023-03-13 DIAGNOSIS — F1120 Opioid dependence, uncomplicated: Secondary | ICD-10-CM | POA: Diagnosis not present

## 2023-03-20 DIAGNOSIS — G35 Multiple sclerosis: Secondary | ICD-10-CM | POA: Diagnosis not present

## 2023-03-20 DIAGNOSIS — F1120 Opioid dependence, uncomplicated: Secondary | ICD-10-CM | POA: Diagnosis not present

## 2023-03-28 DIAGNOSIS — G35 Multiple sclerosis: Secondary | ICD-10-CM | POA: Diagnosis not present

## 2023-03-28 DIAGNOSIS — F1120 Opioid dependence, uncomplicated: Secondary | ICD-10-CM | POA: Diagnosis not present

## 2023-04-03 DIAGNOSIS — G35 Multiple sclerosis: Secondary | ICD-10-CM | POA: Diagnosis not present

## 2023-04-03 DIAGNOSIS — F1120 Opioid dependence, uncomplicated: Secondary | ICD-10-CM | POA: Diagnosis not present

## 2023-04-09 DIAGNOSIS — G35 Multiple sclerosis: Secondary | ICD-10-CM | POA: Diagnosis not present

## 2023-04-09 DIAGNOSIS — F1120 Opioid dependence, uncomplicated: Secondary | ICD-10-CM | POA: Diagnosis not present

## 2023-04-17 DIAGNOSIS — F1120 Opioid dependence, uncomplicated: Secondary | ICD-10-CM | POA: Diagnosis not present

## 2023-04-17 DIAGNOSIS — G35 Multiple sclerosis: Secondary | ICD-10-CM | POA: Diagnosis not present

## 2023-04-24 DIAGNOSIS — F1120 Opioid dependence, uncomplicated: Secondary | ICD-10-CM | POA: Diagnosis not present

## 2023-04-24 DIAGNOSIS — G35 Multiple sclerosis: Secondary | ICD-10-CM | POA: Diagnosis not present

## 2023-05-01 DIAGNOSIS — F1120 Opioid dependence, uncomplicated: Secondary | ICD-10-CM | POA: Diagnosis not present

## 2023-05-08 DIAGNOSIS — G35 Multiple sclerosis: Secondary | ICD-10-CM | POA: Diagnosis not present

## 2023-05-08 DIAGNOSIS — F1120 Opioid dependence, uncomplicated: Secondary | ICD-10-CM | POA: Diagnosis not present

## 2023-05-15 DIAGNOSIS — F1120 Opioid dependence, uncomplicated: Secondary | ICD-10-CM | POA: Diagnosis not present

## 2023-05-15 DIAGNOSIS — G35 Multiple sclerosis: Secondary | ICD-10-CM | POA: Diagnosis not present

## 2023-05-22 DIAGNOSIS — F1120 Opioid dependence, uncomplicated: Secondary | ICD-10-CM | POA: Diagnosis not present

## 2023-05-22 DIAGNOSIS — G35 Multiple sclerosis: Secondary | ICD-10-CM | POA: Diagnosis not present

## 2023-05-29 DIAGNOSIS — F1120 Opioid dependence, uncomplicated: Secondary | ICD-10-CM | POA: Diagnosis not present

## 2023-05-29 DIAGNOSIS — G35 Multiple sclerosis: Secondary | ICD-10-CM | POA: Diagnosis not present

## 2023-06-05 DIAGNOSIS — F1120 Opioid dependence, uncomplicated: Secondary | ICD-10-CM | POA: Diagnosis not present

## 2023-06-05 DIAGNOSIS — G35 Multiple sclerosis: Secondary | ICD-10-CM | POA: Diagnosis not present

## 2023-06-12 DIAGNOSIS — F1120 Opioid dependence, uncomplicated: Secondary | ICD-10-CM | POA: Diagnosis not present

## 2023-06-12 DIAGNOSIS — G35 Multiple sclerosis: Secondary | ICD-10-CM | POA: Diagnosis not present

## 2023-06-19 DIAGNOSIS — F1120 Opioid dependence, uncomplicated: Secondary | ICD-10-CM | POA: Diagnosis not present

## 2023-06-19 DIAGNOSIS — G35 Multiple sclerosis: Secondary | ICD-10-CM | POA: Diagnosis not present

## 2023-06-26 DIAGNOSIS — G35 Multiple sclerosis: Secondary | ICD-10-CM | POA: Diagnosis not present

## 2023-06-26 DIAGNOSIS — F1490 Cocaine use, unspecified, uncomplicated: Secondary | ICD-10-CM | POA: Diagnosis not present

## 2023-06-26 DIAGNOSIS — F1120 Opioid dependence, uncomplicated: Secondary | ICD-10-CM | POA: Diagnosis not present

## 2023-07-03 DIAGNOSIS — F1490 Cocaine use, unspecified, uncomplicated: Secondary | ICD-10-CM | POA: Diagnosis not present

## 2023-07-03 DIAGNOSIS — F1120 Opioid dependence, uncomplicated: Secondary | ICD-10-CM | POA: Diagnosis not present

## 2023-07-03 DIAGNOSIS — G35 Multiple sclerosis: Secondary | ICD-10-CM | POA: Diagnosis not present

## 2023-07-10 DIAGNOSIS — F1120 Opioid dependence, uncomplicated: Secondary | ICD-10-CM | POA: Diagnosis not present

## 2023-07-10 DIAGNOSIS — G35 Multiple sclerosis: Secondary | ICD-10-CM | POA: Diagnosis not present

## 2023-07-10 DIAGNOSIS — F1490 Cocaine use, unspecified, uncomplicated: Secondary | ICD-10-CM | POA: Diagnosis not present

## 2023-07-17 DIAGNOSIS — F1120 Opioid dependence, uncomplicated: Secondary | ICD-10-CM | POA: Diagnosis not present

## 2023-07-17 DIAGNOSIS — F1490 Cocaine use, unspecified, uncomplicated: Secondary | ICD-10-CM | POA: Diagnosis not present

## 2023-07-17 DIAGNOSIS — G35 Multiple sclerosis: Secondary | ICD-10-CM | POA: Diagnosis not present

## 2023-07-24 DIAGNOSIS — F1120 Opioid dependence, uncomplicated: Secondary | ICD-10-CM | POA: Diagnosis not present

## 2023-07-24 DIAGNOSIS — F1490 Cocaine use, unspecified, uncomplicated: Secondary | ICD-10-CM | POA: Diagnosis not present

## 2023-07-24 DIAGNOSIS — G35 Multiple sclerosis: Secondary | ICD-10-CM | POA: Diagnosis not present

## 2023-07-31 DIAGNOSIS — F1490 Cocaine use, unspecified, uncomplicated: Secondary | ICD-10-CM | POA: Diagnosis not present

## 2023-07-31 DIAGNOSIS — G35 Multiple sclerosis: Secondary | ICD-10-CM | POA: Diagnosis not present

## 2023-07-31 DIAGNOSIS — F1120 Opioid dependence, uncomplicated: Secondary | ICD-10-CM | POA: Diagnosis not present

## 2023-08-30 DIAGNOSIS — F1490 Cocaine use, unspecified, uncomplicated: Secondary | ICD-10-CM | POA: Diagnosis not present

## 2023-08-30 DIAGNOSIS — F1120 Opioid dependence, uncomplicated: Secondary | ICD-10-CM | POA: Diagnosis not present

## 2023-08-30 DIAGNOSIS — G35 Multiple sclerosis: Secondary | ICD-10-CM | POA: Diagnosis not present

## 2023-08-30 DIAGNOSIS — G4700 Insomnia, unspecified: Secondary | ICD-10-CM | POA: Diagnosis not present

## 2023-09-05 DIAGNOSIS — G4700 Insomnia, unspecified: Secondary | ICD-10-CM | POA: Diagnosis not present

## 2023-09-05 DIAGNOSIS — G35 Multiple sclerosis: Secondary | ICD-10-CM | POA: Diagnosis not present

## 2023-09-05 DIAGNOSIS — F1490 Cocaine use, unspecified, uncomplicated: Secondary | ICD-10-CM | POA: Diagnosis not present

## 2023-09-05 DIAGNOSIS — F1120 Opioid dependence, uncomplicated: Secondary | ICD-10-CM | POA: Diagnosis not present

## 2023-09-12 DIAGNOSIS — F1120 Opioid dependence, uncomplicated: Secondary | ICD-10-CM | POA: Diagnosis not present

## 2023-09-12 DIAGNOSIS — G35 Multiple sclerosis: Secondary | ICD-10-CM | POA: Diagnosis not present

## 2023-09-12 DIAGNOSIS — G4700 Insomnia, unspecified: Secondary | ICD-10-CM | POA: Diagnosis not present

## 2023-09-12 DIAGNOSIS — F1490 Cocaine use, unspecified, uncomplicated: Secondary | ICD-10-CM | POA: Diagnosis not present

## 2023-09-18 DIAGNOSIS — F1490 Cocaine use, unspecified, uncomplicated: Secondary | ICD-10-CM | POA: Diagnosis not present

## 2023-09-18 DIAGNOSIS — F1120 Opioid dependence, uncomplicated: Secondary | ICD-10-CM | POA: Diagnosis not present

## 2023-09-19 DIAGNOSIS — G35 Multiple sclerosis: Secondary | ICD-10-CM | POA: Diagnosis not present

## 2023-09-19 DIAGNOSIS — F1120 Opioid dependence, uncomplicated: Secondary | ICD-10-CM | POA: Diagnosis not present

## 2023-09-19 DIAGNOSIS — G4700 Insomnia, unspecified: Secondary | ICD-10-CM | POA: Diagnosis not present

## 2023-09-19 DIAGNOSIS — F1490 Cocaine use, unspecified, uncomplicated: Secondary | ICD-10-CM | POA: Diagnosis not present

## 2023-09-25 DIAGNOSIS — F1490 Cocaine use, unspecified, uncomplicated: Secondary | ICD-10-CM | POA: Diagnosis not present

## 2023-09-25 DIAGNOSIS — F1120 Opioid dependence, uncomplicated: Secondary | ICD-10-CM | POA: Diagnosis not present

## 2023-09-26 DIAGNOSIS — F1490 Cocaine use, unspecified, uncomplicated: Secondary | ICD-10-CM | POA: Diagnosis not present

## 2023-09-26 DIAGNOSIS — F1120 Opioid dependence, uncomplicated: Secondary | ICD-10-CM | POA: Diagnosis not present

## 2023-09-26 DIAGNOSIS — G35 Multiple sclerosis: Secondary | ICD-10-CM | POA: Diagnosis not present

## 2023-09-26 DIAGNOSIS — G4700 Insomnia, unspecified: Secondary | ICD-10-CM | POA: Diagnosis not present

## 2023-10-02 DIAGNOSIS — F1120 Opioid dependence, uncomplicated: Secondary | ICD-10-CM | POA: Diagnosis not present

## 2023-10-02 DIAGNOSIS — F1490 Cocaine use, unspecified, uncomplicated: Secondary | ICD-10-CM | POA: Diagnosis not present

## 2023-10-02 DIAGNOSIS — G4700 Insomnia, unspecified: Secondary | ICD-10-CM | POA: Diagnosis not present

## 2023-10-03 DIAGNOSIS — G35 Multiple sclerosis: Secondary | ICD-10-CM | POA: Diagnosis not present

## 2023-10-03 DIAGNOSIS — F1120 Opioid dependence, uncomplicated: Secondary | ICD-10-CM | POA: Diagnosis not present

## 2023-10-03 DIAGNOSIS — F1490 Cocaine use, unspecified, uncomplicated: Secondary | ICD-10-CM | POA: Diagnosis not present

## 2023-10-03 DIAGNOSIS — G4700 Insomnia, unspecified: Secondary | ICD-10-CM | POA: Diagnosis not present

## 2023-10-09 DIAGNOSIS — F1120 Opioid dependence, uncomplicated: Secondary | ICD-10-CM | POA: Diagnosis not present

## 2023-10-09 DIAGNOSIS — F1490 Cocaine use, unspecified, uncomplicated: Secondary | ICD-10-CM | POA: Diagnosis not present

## 2023-10-10 DIAGNOSIS — G4700 Insomnia, unspecified: Secondary | ICD-10-CM | POA: Diagnosis not present

## 2023-10-10 DIAGNOSIS — G35 Multiple sclerosis: Secondary | ICD-10-CM | POA: Diagnosis not present

## 2023-10-10 DIAGNOSIS — F1120 Opioid dependence, uncomplicated: Secondary | ICD-10-CM | POA: Diagnosis not present

## 2023-10-10 DIAGNOSIS — F1490 Cocaine use, unspecified, uncomplicated: Secondary | ICD-10-CM | POA: Diagnosis not present

## 2023-10-17 DIAGNOSIS — G35 Multiple sclerosis: Secondary | ICD-10-CM | POA: Diagnosis not present

## 2023-10-17 DIAGNOSIS — F1490 Cocaine use, unspecified, uncomplicated: Secondary | ICD-10-CM | POA: Diagnosis not present

## 2023-10-17 DIAGNOSIS — G4700 Insomnia, unspecified: Secondary | ICD-10-CM | POA: Diagnosis not present

## 2023-10-17 DIAGNOSIS — F1120 Opioid dependence, uncomplicated: Secondary | ICD-10-CM | POA: Diagnosis not present

## 2023-10-18 DIAGNOSIS — F1120 Opioid dependence, uncomplicated: Secondary | ICD-10-CM | POA: Diagnosis not present

## 2023-10-18 DIAGNOSIS — G4700 Insomnia, unspecified: Secondary | ICD-10-CM | POA: Diagnosis not present

## 2023-10-18 DIAGNOSIS — F1490 Cocaine use, unspecified, uncomplicated: Secondary | ICD-10-CM | POA: Diagnosis not present

## 2023-10-23 DIAGNOSIS — F1490 Cocaine use, unspecified, uncomplicated: Secondary | ICD-10-CM | POA: Diagnosis not present

## 2023-10-23 DIAGNOSIS — F1120 Opioid dependence, uncomplicated: Secondary | ICD-10-CM | POA: Diagnosis not present

## 2023-10-24 DIAGNOSIS — F1120 Opioid dependence, uncomplicated: Secondary | ICD-10-CM | POA: Diagnosis not present

## 2023-10-24 DIAGNOSIS — G35 Multiple sclerosis: Secondary | ICD-10-CM | POA: Diagnosis not present

## 2023-10-24 DIAGNOSIS — F1490 Cocaine use, unspecified, uncomplicated: Secondary | ICD-10-CM | POA: Diagnosis not present

## 2023-10-24 DIAGNOSIS — G4700 Insomnia, unspecified: Secondary | ICD-10-CM | POA: Diagnosis not present

## 2023-10-30 DIAGNOSIS — F1490 Cocaine use, unspecified, uncomplicated: Secondary | ICD-10-CM | POA: Diagnosis not present

## 2023-10-30 DIAGNOSIS — F1120 Opioid dependence, uncomplicated: Secondary | ICD-10-CM | POA: Diagnosis not present

## 2023-10-31 DIAGNOSIS — F1490 Cocaine use, unspecified, uncomplicated: Secondary | ICD-10-CM | POA: Diagnosis not present

## 2023-10-31 DIAGNOSIS — F1120 Opioid dependence, uncomplicated: Secondary | ICD-10-CM | POA: Diagnosis not present

## 2023-10-31 DIAGNOSIS — G35 Multiple sclerosis: Secondary | ICD-10-CM | POA: Diagnosis not present

## 2023-10-31 DIAGNOSIS — G4700 Insomnia, unspecified: Secondary | ICD-10-CM | POA: Diagnosis not present

## 2023-11-06 DIAGNOSIS — F1120 Opioid dependence, uncomplicated: Secondary | ICD-10-CM | POA: Diagnosis not present

## 2023-11-06 DIAGNOSIS — F1490 Cocaine use, unspecified, uncomplicated: Secondary | ICD-10-CM | POA: Diagnosis not present

## 2023-11-07 DIAGNOSIS — G35 Multiple sclerosis: Secondary | ICD-10-CM | POA: Diagnosis not present

## 2023-11-07 DIAGNOSIS — F1490 Cocaine use, unspecified, uncomplicated: Secondary | ICD-10-CM | POA: Diagnosis not present

## 2023-11-07 DIAGNOSIS — F1120 Opioid dependence, uncomplicated: Secondary | ICD-10-CM | POA: Diagnosis not present

## 2023-11-07 DIAGNOSIS — G4700 Insomnia, unspecified: Secondary | ICD-10-CM | POA: Diagnosis not present

## 2023-11-13 DIAGNOSIS — G4700 Insomnia, unspecified: Secondary | ICD-10-CM | POA: Diagnosis not present

## 2023-11-13 DIAGNOSIS — F1120 Opioid dependence, uncomplicated: Secondary | ICD-10-CM | POA: Diagnosis not present

## 2023-11-13 DIAGNOSIS — F1490 Cocaine use, unspecified, uncomplicated: Secondary | ICD-10-CM | POA: Diagnosis not present

## 2023-11-14 DIAGNOSIS — F1120 Opioid dependence, uncomplicated: Secondary | ICD-10-CM | POA: Diagnosis not present

## 2023-11-14 DIAGNOSIS — G35 Multiple sclerosis: Secondary | ICD-10-CM | POA: Diagnosis not present

## 2023-11-14 DIAGNOSIS — F1490 Cocaine use, unspecified, uncomplicated: Secondary | ICD-10-CM | POA: Diagnosis not present

## 2023-11-14 DIAGNOSIS — G4700 Insomnia, unspecified: Secondary | ICD-10-CM | POA: Diagnosis not present

## 2023-11-20 DIAGNOSIS — F1120 Opioid dependence, uncomplicated: Secondary | ICD-10-CM | POA: Diagnosis not present

## 2023-11-20 DIAGNOSIS — G35 Multiple sclerosis: Secondary | ICD-10-CM | POA: Diagnosis not present

## 2023-11-20 DIAGNOSIS — F1490 Cocaine use, unspecified, uncomplicated: Secondary | ICD-10-CM | POA: Diagnosis not present

## 2023-11-20 DIAGNOSIS — G4700 Insomnia, unspecified: Secondary | ICD-10-CM | POA: Diagnosis not present

## 2023-11-21 DIAGNOSIS — F1120 Opioid dependence, uncomplicated: Secondary | ICD-10-CM | POA: Diagnosis not present

## 2023-11-21 DIAGNOSIS — G4700 Insomnia, unspecified: Secondary | ICD-10-CM | POA: Diagnosis not present

## 2023-11-21 DIAGNOSIS — G35 Multiple sclerosis: Secondary | ICD-10-CM | POA: Diagnosis not present

## 2023-11-21 DIAGNOSIS — F1490 Cocaine use, unspecified, uncomplicated: Secondary | ICD-10-CM | POA: Diagnosis not present

## 2023-11-27 DIAGNOSIS — F1410 Cocaine abuse, uncomplicated: Secondary | ICD-10-CM | POA: Diagnosis not present

## 2023-11-28 DIAGNOSIS — F1120 Opioid dependence, uncomplicated: Secondary | ICD-10-CM | POA: Diagnosis not present

## 2023-11-28 DIAGNOSIS — G35 Multiple sclerosis: Secondary | ICD-10-CM | POA: Diagnosis not present

## 2023-11-28 DIAGNOSIS — F1490 Cocaine use, unspecified, uncomplicated: Secondary | ICD-10-CM | POA: Diagnosis not present

## 2023-11-28 DIAGNOSIS — G4700 Insomnia, unspecified: Secondary | ICD-10-CM | POA: Diagnosis not present

## 2023-12-04 DIAGNOSIS — F1120 Opioid dependence, uncomplicated: Secondary | ICD-10-CM | POA: Diagnosis not present

## 2023-12-04 DIAGNOSIS — F1490 Cocaine use, unspecified, uncomplicated: Secondary | ICD-10-CM | POA: Diagnosis not present

## 2023-12-04 DIAGNOSIS — G4700 Insomnia, unspecified: Secondary | ICD-10-CM | POA: Diagnosis not present

## 2023-12-05 DIAGNOSIS — G35 Multiple sclerosis: Secondary | ICD-10-CM | POA: Diagnosis not present

## 2023-12-05 DIAGNOSIS — G4700 Insomnia, unspecified: Secondary | ICD-10-CM | POA: Diagnosis not present

## 2023-12-05 DIAGNOSIS — F1120 Opioid dependence, uncomplicated: Secondary | ICD-10-CM | POA: Diagnosis not present

## 2023-12-05 DIAGNOSIS — F1420 Cocaine dependence, uncomplicated: Secondary | ICD-10-CM | POA: Diagnosis not present

## 2023-12-12 DIAGNOSIS — G35 Multiple sclerosis: Secondary | ICD-10-CM | POA: Diagnosis not present

## 2023-12-12 DIAGNOSIS — F1420 Cocaine dependence, uncomplicated: Secondary | ICD-10-CM | POA: Diagnosis not present

## 2023-12-12 DIAGNOSIS — F1120 Opioid dependence, uncomplicated: Secondary | ICD-10-CM | POA: Diagnosis not present

## 2023-12-12 DIAGNOSIS — G4700 Insomnia, unspecified: Secondary | ICD-10-CM | POA: Diagnosis not present

## 2023-12-18 DIAGNOSIS — F1120 Opioid dependence, uncomplicated: Secondary | ICD-10-CM | POA: Diagnosis not present

## 2023-12-18 DIAGNOSIS — F1490 Cocaine use, unspecified, uncomplicated: Secondary | ICD-10-CM | POA: Diagnosis not present

## 2023-12-19 DIAGNOSIS — F1420 Cocaine dependence, uncomplicated: Secondary | ICD-10-CM | POA: Diagnosis not present

## 2023-12-19 DIAGNOSIS — F1120 Opioid dependence, uncomplicated: Secondary | ICD-10-CM | POA: Diagnosis not present

## 2023-12-19 DIAGNOSIS — G4700 Insomnia, unspecified: Secondary | ICD-10-CM | POA: Diagnosis not present

## 2023-12-19 DIAGNOSIS — G35 Multiple sclerosis: Secondary | ICD-10-CM | POA: Diagnosis not present

## 2023-12-25 DIAGNOSIS — F1490 Cocaine use, unspecified, uncomplicated: Secondary | ICD-10-CM | POA: Diagnosis not present

## 2023-12-25 DIAGNOSIS — F1120 Opioid dependence, uncomplicated: Secondary | ICD-10-CM | POA: Diagnosis not present

## 2023-12-26 DIAGNOSIS — F1490 Cocaine use, unspecified, uncomplicated: Secondary | ICD-10-CM | POA: Diagnosis not present

## 2023-12-26 DIAGNOSIS — F1120 Opioid dependence, uncomplicated: Secondary | ICD-10-CM | POA: Diagnosis not present

## 2023-12-26 DIAGNOSIS — G4700 Insomnia, unspecified: Secondary | ICD-10-CM | POA: Diagnosis not present

## 2023-12-26 DIAGNOSIS — G35 Multiple sclerosis: Secondary | ICD-10-CM | POA: Diagnosis not present

## 2024-01-01 DIAGNOSIS — F1490 Cocaine use, unspecified, uncomplicated: Secondary | ICD-10-CM | POA: Diagnosis not present

## 2024-01-01 DIAGNOSIS — F1120 Opioid dependence, uncomplicated: Secondary | ICD-10-CM | POA: Diagnosis not present

## 2024-01-02 DIAGNOSIS — F1120 Opioid dependence, uncomplicated: Secondary | ICD-10-CM | POA: Diagnosis not present

## 2024-01-02 DIAGNOSIS — G4700 Insomnia, unspecified: Secondary | ICD-10-CM | POA: Diagnosis not present

## 2024-01-02 DIAGNOSIS — G35 Multiple sclerosis: Secondary | ICD-10-CM | POA: Diagnosis not present

## 2024-01-02 DIAGNOSIS — F1420 Cocaine dependence, uncomplicated: Secondary | ICD-10-CM | POA: Diagnosis not present

## 2024-01-08 DIAGNOSIS — G35 Multiple sclerosis: Secondary | ICD-10-CM | POA: Diagnosis not present

## 2024-01-08 DIAGNOSIS — F4381 Prolonged grief disorder: Secondary | ICD-10-CM | POA: Diagnosis not present

## 2024-01-08 DIAGNOSIS — G4700 Insomnia, unspecified: Secondary | ICD-10-CM | POA: Diagnosis not present

## 2024-01-08 DIAGNOSIS — F1490 Cocaine use, unspecified, uncomplicated: Secondary | ICD-10-CM | POA: Diagnosis not present

## 2024-01-09 DIAGNOSIS — F1120 Opioid dependence, uncomplicated: Secondary | ICD-10-CM | POA: Diagnosis not present

## 2024-01-09 DIAGNOSIS — F1490 Cocaine use, unspecified, uncomplicated: Secondary | ICD-10-CM | POA: Diagnosis not present

## 2024-01-09 DIAGNOSIS — G4700 Insomnia, unspecified: Secondary | ICD-10-CM | POA: Diagnosis not present

## 2024-01-09 DIAGNOSIS — G35 Multiple sclerosis: Secondary | ICD-10-CM | POA: Diagnosis not present

## 2024-01-16 DIAGNOSIS — F4381 Prolonged grief disorder: Secondary | ICD-10-CM | POA: Diagnosis not present

## 2024-01-16 DIAGNOSIS — F1120 Opioid dependence, uncomplicated: Secondary | ICD-10-CM | POA: Diagnosis not present

## 2024-01-16 DIAGNOSIS — G4700 Insomnia, unspecified: Secondary | ICD-10-CM | POA: Diagnosis not present

## 2024-01-16 DIAGNOSIS — G35 Multiple sclerosis: Secondary | ICD-10-CM | POA: Diagnosis not present

## 2024-01-16 DIAGNOSIS — F1490 Cocaine use, unspecified, uncomplicated: Secondary | ICD-10-CM | POA: Diagnosis not present

## 2024-01-17 DIAGNOSIS — F1120 Opioid dependence, uncomplicated: Secondary | ICD-10-CM | POA: Diagnosis not present

## 2024-01-17 DIAGNOSIS — F1490 Cocaine use, unspecified, uncomplicated: Secondary | ICD-10-CM | POA: Diagnosis not present

## 2024-01-23 DIAGNOSIS — G35 Multiple sclerosis: Secondary | ICD-10-CM | POA: Diagnosis not present

## 2024-01-23 DIAGNOSIS — F4381 Prolonged grief disorder: Secondary | ICD-10-CM | POA: Diagnosis not present

## 2024-01-23 DIAGNOSIS — F1120 Opioid dependence, uncomplicated: Secondary | ICD-10-CM | POA: Diagnosis not present

## 2024-01-23 DIAGNOSIS — F1490 Cocaine use, unspecified, uncomplicated: Secondary | ICD-10-CM | POA: Diagnosis not present

## 2024-01-23 DIAGNOSIS — G4700 Insomnia, unspecified: Secondary | ICD-10-CM | POA: Diagnosis not present

## 2024-01-24 DIAGNOSIS — F1120 Opioid dependence, uncomplicated: Secondary | ICD-10-CM | POA: Diagnosis not present

## 2024-01-24 DIAGNOSIS — F1490 Cocaine use, unspecified, uncomplicated: Secondary | ICD-10-CM | POA: Diagnosis not present

## 2024-01-24 DIAGNOSIS — F1420 Cocaine dependence, uncomplicated: Secondary | ICD-10-CM | POA: Diagnosis not present

## 2024-01-30 DIAGNOSIS — F1120 Opioid dependence, uncomplicated: Secondary | ICD-10-CM | POA: Diagnosis not present

## 2024-01-30 DIAGNOSIS — G35 Multiple sclerosis: Secondary | ICD-10-CM | POA: Diagnosis not present

## 2024-01-30 DIAGNOSIS — F1490 Cocaine use, unspecified, uncomplicated: Secondary | ICD-10-CM | POA: Diagnosis not present

## 2024-01-30 DIAGNOSIS — F4381 Prolonged grief disorder: Secondary | ICD-10-CM | POA: Diagnosis not present

## 2024-01-31 DIAGNOSIS — F1420 Cocaine dependence, uncomplicated: Secondary | ICD-10-CM | POA: Diagnosis not present

## 2024-01-31 DIAGNOSIS — F1120 Opioid dependence, uncomplicated: Secondary | ICD-10-CM | POA: Diagnosis not present

## 2024-01-31 DIAGNOSIS — F4381 Prolonged grief disorder: Secondary | ICD-10-CM | POA: Diagnosis not present

## 2024-02-06 DIAGNOSIS — G4700 Insomnia, unspecified: Secondary | ICD-10-CM | POA: Diagnosis not present

## 2024-02-06 DIAGNOSIS — F1120 Opioid dependence, uncomplicated: Secondary | ICD-10-CM | POA: Diagnosis not present

## 2024-02-06 DIAGNOSIS — G35 Multiple sclerosis: Secondary | ICD-10-CM | POA: Diagnosis not present

## 2024-02-06 DIAGNOSIS — F4381 Prolonged grief disorder: Secondary | ICD-10-CM | POA: Diagnosis not present

## 2024-02-13 DIAGNOSIS — F1120 Opioid dependence, uncomplicated: Secondary | ICD-10-CM | POA: Diagnosis not present

## 2024-02-13 DIAGNOSIS — F1490 Cocaine use, unspecified, uncomplicated: Secondary | ICD-10-CM | POA: Diagnosis not present

## 2024-02-13 DIAGNOSIS — G4700 Insomnia, unspecified: Secondary | ICD-10-CM | POA: Diagnosis not present

## 2024-02-13 DIAGNOSIS — G35 Multiple sclerosis: Secondary | ICD-10-CM | POA: Diagnosis not present

## 2024-02-21 DIAGNOSIS — G35 Multiple sclerosis: Secondary | ICD-10-CM | POA: Diagnosis not present

## 2024-02-21 DIAGNOSIS — F1420 Cocaine dependence, uncomplicated: Secondary | ICD-10-CM | POA: Diagnosis not present

## 2024-02-21 DIAGNOSIS — F1120 Opioid dependence, uncomplicated: Secondary | ICD-10-CM | POA: Diagnosis not present

## 2024-02-21 DIAGNOSIS — G4700 Insomnia, unspecified: Secondary | ICD-10-CM | POA: Diagnosis not present

## 2024-02-27 DIAGNOSIS — F1490 Cocaine use, unspecified, uncomplicated: Secondary | ICD-10-CM | POA: Diagnosis not present

## 2024-02-27 DIAGNOSIS — F4381 Prolonged grief disorder: Secondary | ICD-10-CM | POA: Diagnosis not present

## 2024-02-27 DIAGNOSIS — F1120 Opioid dependence, uncomplicated: Secondary | ICD-10-CM | POA: Diagnosis not present

## 2024-02-27 DIAGNOSIS — G35 Multiple sclerosis: Secondary | ICD-10-CM | POA: Diagnosis not present

## 2024-02-28 DIAGNOSIS — F4381 Prolonged grief disorder: Secondary | ICD-10-CM | POA: Diagnosis not present

## 2024-02-28 DIAGNOSIS — F1120 Opioid dependence, uncomplicated: Secondary | ICD-10-CM | POA: Diagnosis not present

## 2024-02-28 DIAGNOSIS — F1490 Cocaine use, unspecified, uncomplicated: Secondary | ICD-10-CM | POA: Diagnosis not present

## 2024-03-05 DIAGNOSIS — F1120 Opioid dependence, uncomplicated: Secondary | ICD-10-CM | POA: Diagnosis not present

## 2024-03-06 DIAGNOSIS — F1120 Opioid dependence, uncomplicated: Secondary | ICD-10-CM | POA: Diagnosis not present

## 2024-03-06 DIAGNOSIS — F4381 Prolonged grief disorder: Secondary | ICD-10-CM | POA: Diagnosis not present

## 2024-03-06 DIAGNOSIS — F1490 Cocaine use, unspecified, uncomplicated: Secondary | ICD-10-CM | POA: Diagnosis not present

## 2024-03-12 DIAGNOSIS — G35 Multiple sclerosis: Secondary | ICD-10-CM | POA: Diagnosis not present

## 2024-03-12 DIAGNOSIS — F1120 Opioid dependence, uncomplicated: Secondary | ICD-10-CM | POA: Diagnosis not present

## 2024-03-12 DIAGNOSIS — F1490 Cocaine use, unspecified, uncomplicated: Secondary | ICD-10-CM | POA: Diagnosis not present

## 2024-03-12 DIAGNOSIS — G4700 Insomnia, unspecified: Secondary | ICD-10-CM | POA: Diagnosis not present

## 2024-03-19 DIAGNOSIS — F1490 Cocaine use, unspecified, uncomplicated: Secondary | ICD-10-CM | POA: Diagnosis not present

## 2024-03-19 DIAGNOSIS — F1120 Opioid dependence, uncomplicated: Secondary | ICD-10-CM | POA: Diagnosis not present

## 2024-03-19 DIAGNOSIS — F4381 Prolonged grief disorder: Secondary | ICD-10-CM | POA: Diagnosis not present

## 2024-03-19 DIAGNOSIS — G4700 Insomnia, unspecified: Secondary | ICD-10-CM | POA: Diagnosis not present

## 2024-03-26 DIAGNOSIS — F1120 Opioid dependence, uncomplicated: Secondary | ICD-10-CM | POA: Diagnosis not present

## 2024-03-26 DIAGNOSIS — F1490 Cocaine use, unspecified, uncomplicated: Secondary | ICD-10-CM | POA: Diagnosis not present

## 2024-03-26 DIAGNOSIS — F4381 Prolonged grief disorder: Secondary | ICD-10-CM | POA: Diagnosis not present

## 2024-03-26 DIAGNOSIS — G35 Multiple sclerosis: Secondary | ICD-10-CM | POA: Diagnosis not present

## 2024-03-27 DIAGNOSIS — F1490 Cocaine use, unspecified, uncomplicated: Secondary | ICD-10-CM | POA: Diagnosis not present

## 2024-03-27 DIAGNOSIS — F1120 Opioid dependence, uncomplicated: Secondary | ICD-10-CM | POA: Diagnosis not present

## 2024-03-27 DIAGNOSIS — G35 Multiple sclerosis: Secondary | ICD-10-CM | POA: Diagnosis not present

## 2024-03-27 DIAGNOSIS — F4381 Prolonged grief disorder: Secondary | ICD-10-CM | POA: Diagnosis not present

## 2024-03-27 DIAGNOSIS — G4700 Insomnia, unspecified: Secondary | ICD-10-CM | POA: Diagnosis not present

## 2024-04-02 DIAGNOSIS — F4381 Prolonged grief disorder: Secondary | ICD-10-CM | POA: Diagnosis not present

## 2024-04-02 DIAGNOSIS — F1120 Opioid dependence, uncomplicated: Secondary | ICD-10-CM | POA: Diagnosis not present

## 2024-04-02 DIAGNOSIS — G4700 Insomnia, unspecified: Secondary | ICD-10-CM | POA: Diagnosis not present

## 2024-04-02 DIAGNOSIS — G35 Multiple sclerosis: Secondary | ICD-10-CM | POA: Diagnosis not present

## 2024-04-02 DIAGNOSIS — F1490 Cocaine use, unspecified, uncomplicated: Secondary | ICD-10-CM | POA: Diagnosis not present

## 2024-04-03 DIAGNOSIS — F1120 Opioid dependence, uncomplicated: Secondary | ICD-10-CM | POA: Diagnosis not present

## 2024-04-03 DIAGNOSIS — F4381 Prolonged grief disorder: Secondary | ICD-10-CM | POA: Diagnosis not present

## 2024-04-03 DIAGNOSIS — F1420 Cocaine dependence, uncomplicated: Secondary | ICD-10-CM | POA: Diagnosis not present

## 2024-04-03 DIAGNOSIS — F1490 Cocaine use, unspecified, uncomplicated: Secondary | ICD-10-CM | POA: Diagnosis not present

## 2024-04-09 DIAGNOSIS — F1120 Opioid dependence, uncomplicated: Secondary | ICD-10-CM | POA: Diagnosis not present

## 2024-04-09 DIAGNOSIS — F4381 Prolonged grief disorder: Secondary | ICD-10-CM | POA: Diagnosis not present

## 2024-04-09 DIAGNOSIS — G35 Multiple sclerosis: Secondary | ICD-10-CM | POA: Diagnosis not present

## 2024-04-09 DIAGNOSIS — F1490 Cocaine use, unspecified, uncomplicated: Secondary | ICD-10-CM | POA: Diagnosis not present

## 2024-04-09 DIAGNOSIS — G4700 Insomnia, unspecified: Secondary | ICD-10-CM | POA: Diagnosis not present

## 2024-04-16 DIAGNOSIS — G4700 Insomnia, unspecified: Secondary | ICD-10-CM | POA: Diagnosis not present

## 2024-04-16 DIAGNOSIS — F1120 Opioid dependence, uncomplicated: Secondary | ICD-10-CM | POA: Diagnosis not present

## 2024-04-16 DIAGNOSIS — F4381 Prolonged grief disorder: Secondary | ICD-10-CM | POA: Diagnosis not present

## 2024-04-16 DIAGNOSIS — F1490 Cocaine use, unspecified, uncomplicated: Secondary | ICD-10-CM | POA: Diagnosis not present

## 2024-04-23 DIAGNOSIS — F4381 Prolonged grief disorder: Secondary | ICD-10-CM | POA: Diagnosis not present

## 2024-04-23 DIAGNOSIS — G4700 Insomnia, unspecified: Secondary | ICD-10-CM | POA: Diagnosis not present

## 2024-04-23 DIAGNOSIS — F1120 Opioid dependence, uncomplicated: Secondary | ICD-10-CM | POA: Diagnosis not present

## 2024-04-23 DIAGNOSIS — F1490 Cocaine use, unspecified, uncomplicated: Secondary | ICD-10-CM | POA: Diagnosis not present

## 2024-04-30 DIAGNOSIS — F1120 Opioid dependence, uncomplicated: Secondary | ICD-10-CM | POA: Diagnosis not present

## 2024-04-30 DIAGNOSIS — G4700 Insomnia, unspecified: Secondary | ICD-10-CM | POA: Diagnosis not present

## 2024-04-30 DIAGNOSIS — F4381 Prolonged grief disorder: Secondary | ICD-10-CM | POA: Diagnosis not present

## 2024-04-30 DIAGNOSIS — F1420 Cocaine dependence, uncomplicated: Secondary | ICD-10-CM | POA: Diagnosis not present

## 2024-05-08 DIAGNOSIS — F4381 Prolonged grief disorder: Secondary | ICD-10-CM | POA: Diagnosis not present

## 2024-05-08 DIAGNOSIS — G4700 Insomnia, unspecified: Secondary | ICD-10-CM | POA: Diagnosis not present

## 2024-05-08 DIAGNOSIS — F1490 Cocaine use, unspecified, uncomplicated: Secondary | ICD-10-CM | POA: Diagnosis not present

## 2024-05-08 DIAGNOSIS — F1120 Opioid dependence, uncomplicated: Secondary | ICD-10-CM | POA: Diagnosis not present

## 2024-05-14 DIAGNOSIS — G4700 Insomnia, unspecified: Secondary | ICD-10-CM | POA: Diagnosis not present

## 2024-05-14 DIAGNOSIS — F1120 Opioid dependence, uncomplicated: Secondary | ICD-10-CM | POA: Diagnosis not present

## 2024-05-14 DIAGNOSIS — F1490 Cocaine use, unspecified, uncomplicated: Secondary | ICD-10-CM | POA: Diagnosis not present

## 2024-05-14 DIAGNOSIS — F4381 Prolonged grief disorder: Secondary | ICD-10-CM | POA: Diagnosis not present

## 2024-05-21 DIAGNOSIS — F1120 Opioid dependence, uncomplicated: Secondary | ICD-10-CM | POA: Diagnosis not present

## 2024-05-21 DIAGNOSIS — F1490 Cocaine use, unspecified, uncomplicated: Secondary | ICD-10-CM | POA: Diagnosis not present

## 2024-05-21 DIAGNOSIS — F4381 Prolonged grief disorder: Secondary | ICD-10-CM | POA: Diagnosis not present

## 2024-05-21 DIAGNOSIS — G4700 Insomnia, unspecified: Secondary | ICD-10-CM | POA: Diagnosis not present

## 2024-05-22 DIAGNOSIS — F1120 Opioid dependence, uncomplicated: Secondary | ICD-10-CM | POA: Diagnosis not present

## 2024-05-22 DIAGNOSIS — F1420 Cocaine dependence, uncomplicated: Secondary | ICD-10-CM | POA: Diagnosis not present

## 2024-05-22 DIAGNOSIS — F4381 Prolonged grief disorder: Secondary | ICD-10-CM | POA: Diagnosis not present

## 2024-05-28 DIAGNOSIS — F1490 Cocaine use, unspecified, uncomplicated: Secondary | ICD-10-CM | POA: Diagnosis not present

## 2024-05-28 DIAGNOSIS — G4700 Insomnia, unspecified: Secondary | ICD-10-CM | POA: Diagnosis not present

## 2024-05-28 DIAGNOSIS — F1120 Opioid dependence, uncomplicated: Secondary | ICD-10-CM | POA: Diagnosis not present

## 2024-05-28 DIAGNOSIS — F4381 Prolonged grief disorder: Secondary | ICD-10-CM | POA: Diagnosis not present

## 2024-06-04 DIAGNOSIS — F4381 Prolonged grief disorder: Secondary | ICD-10-CM | POA: Diagnosis not present

## 2024-06-04 DIAGNOSIS — F1490 Cocaine use, unspecified, uncomplicated: Secondary | ICD-10-CM | POA: Diagnosis not present

## 2024-06-04 DIAGNOSIS — G4700 Insomnia, unspecified: Secondary | ICD-10-CM | POA: Diagnosis not present

## 2024-06-04 DIAGNOSIS — F1120 Opioid dependence, uncomplicated: Secondary | ICD-10-CM | POA: Diagnosis not present

## 2024-06-05 DIAGNOSIS — F1120 Opioid dependence, uncomplicated: Secondary | ICD-10-CM | POA: Diagnosis not present

## 2024-06-05 DIAGNOSIS — F1420 Cocaine dependence, uncomplicated: Secondary | ICD-10-CM | POA: Diagnosis not present

## 2024-06-05 DIAGNOSIS — F4381 Prolonged grief disorder: Secondary | ICD-10-CM | POA: Diagnosis not present

## 2024-06-11 DIAGNOSIS — F4381 Prolonged grief disorder: Secondary | ICD-10-CM | POA: Diagnosis not present

## 2024-06-11 DIAGNOSIS — G4700 Insomnia, unspecified: Secondary | ICD-10-CM | POA: Diagnosis not present

## 2024-06-11 DIAGNOSIS — F1490 Cocaine use, unspecified, uncomplicated: Secondary | ICD-10-CM | POA: Diagnosis not present

## 2024-06-11 DIAGNOSIS — G35D Multiple sclerosis, unspecified: Secondary | ICD-10-CM | POA: Diagnosis not present

## 2024-06-18 DIAGNOSIS — F1120 Opioid dependence, uncomplicated: Secondary | ICD-10-CM | POA: Diagnosis not present

## 2024-06-18 DIAGNOSIS — F1420 Cocaine dependence, uncomplicated: Secondary | ICD-10-CM | POA: Diagnosis not present

## 2024-06-18 DIAGNOSIS — F4381 Prolonged grief disorder: Secondary | ICD-10-CM | POA: Diagnosis not present

## 2024-06-18 DIAGNOSIS — G4700 Insomnia, unspecified: Secondary | ICD-10-CM | POA: Diagnosis not present

## 2024-06-19 DIAGNOSIS — F1490 Cocaine use, unspecified, uncomplicated: Secondary | ICD-10-CM | POA: Diagnosis not present

## 2024-06-19 DIAGNOSIS — F1120 Opioid dependence, uncomplicated: Secondary | ICD-10-CM | POA: Diagnosis not present

## 2024-06-19 DIAGNOSIS — F4381 Prolonged grief disorder: Secondary | ICD-10-CM | POA: Diagnosis not present

## 2024-06-25 DIAGNOSIS — F1420 Cocaine dependence, uncomplicated: Secondary | ICD-10-CM | POA: Diagnosis not present

## 2024-06-25 DIAGNOSIS — F4381 Prolonged grief disorder: Secondary | ICD-10-CM | POA: Diagnosis not present

## 2024-06-25 DIAGNOSIS — F1120 Opioid dependence, uncomplicated: Secondary | ICD-10-CM | POA: Diagnosis not present

## 2024-06-25 DIAGNOSIS — G4700 Insomnia, unspecified: Secondary | ICD-10-CM | POA: Diagnosis not present
